# Patient Record
Sex: Female | Born: 1965 | Race: White | Hispanic: No | Smoking: Current every day smoker
Health system: Southern US, Community
[De-identification: ages and names within clinical notes are randomized; demographics above are authoritative.]

## PROBLEM LIST (undated history)

## (undated) ENCOUNTER — Ambulatory Visit (HOSPITAL_COMMUNITY): Admission: EM | Discharge status: Absent without leave | Payer: 59

## (undated) HISTORY — PX: TUBAL LIGATION: SHX77

---

## 1999-03-04 HISTORY — PX: KNEE SURGERY: SHX244

## 2007-05-11 ENCOUNTER — Ambulatory Visit: Payer: Self-pay | Admitting: Nurse Practitioner

## 2007-06-28 ENCOUNTER — Ambulatory Visit: Payer: Self-pay | Admitting: Family Medicine

## 2008-09-26 ENCOUNTER — Ambulatory Visit: Payer: Self-pay | Admitting: Nurse Practitioner

## 2009-03-03 HISTORY — PX: CATARACT EXTRACTION: SUR2

## 2012-08-16 ENCOUNTER — Ambulatory Visit: Payer: Self-pay

## 2012-08-18 ENCOUNTER — Ambulatory Visit: Payer: Self-pay | Admitting: Family Medicine

## 2012-08-19 ENCOUNTER — Ambulatory Visit: Payer: Self-pay

## 2012-08-25 LAB — WOUND CULTURE

## 2017-03-30 ENCOUNTER — Ambulatory Visit (INDEPENDENT_AMBULATORY_CARE_PROVIDER_SITE_OTHER): Payer: Managed Care, Other (non HMO) | Admitting: Internal Medicine

## 2017-03-30 ENCOUNTER — Encounter: Payer: Self-pay | Admitting: Internal Medicine

## 2017-03-30 VITALS — BP 124/84 | HR 84 | Ht 65.0 in | Wt 223.0 lb

## 2017-03-30 DIAGNOSIS — M722 Plantar fascial fibromatosis: Secondary | ICD-10-CM | POA: Diagnosis not present

## 2017-03-30 DIAGNOSIS — L309 Dermatitis, unspecified: Secondary | ICD-10-CM

## 2017-03-30 DIAGNOSIS — Z1231 Encounter for screening mammogram for malignant neoplasm of breast: Secondary | ICD-10-CM | POA: Diagnosis not present

## 2017-03-30 DIAGNOSIS — N951 Menopausal and female climacteric states: Secondary | ICD-10-CM | POA: Insufficient documentation

## 2017-03-30 DIAGNOSIS — F17200 Nicotine dependence, unspecified, uncomplicated: Secondary | ICD-10-CM | POA: Insufficient documentation

## 2017-03-30 DIAGNOSIS — Z6837 Body mass index (BMI) 37.0-37.9, adult: Secondary | ICD-10-CM | POA: Diagnosis not present

## 2017-03-30 DIAGNOSIS — Z1239 Encounter for other screening for malignant neoplasm of breast: Secondary | ICD-10-CM

## 2017-03-30 MED ORDER — TRIAMCINOLONE ACETONIDE 0.1 % EX CREA
1.0000 | TOPICAL_CREAM | Freq: Two times a day (BID) | CUTANEOUS | 0 refills | Status: DC
Start: 2017-03-30 — End: 2017-06-26

## 2017-03-30 MED ORDER — VENLAFAXINE HCL ER 37.5 MG PO CP24: 37.5000 mg | ORAL_CAPSULE | Freq: Every day | ORAL | 5 refills | Status: DC

## 2017-03-30 NOTE — Progress Notes (Signed)
Date:  03/30/2017   Name:  Natalie Osborne   DOB:  10/30/1965   MRN:  [id number]   Chief Complaint: Establish Care (Has not seen doctor in 12 years. ); Foot Pain (Rt foot pain. Does a lot of walking at work. Pain becomes worse with this. ); Rash (Started 3-4 years ago.. Rash shows up on both palms of hands and right foot. ); and Hot Flashes (Constantly hot. Wakes up several times a night to have to stand up and cool down. )  Foot Pain  This is a chronic problem. The current episode started more than 1 year ago. The problem occurs daily. The problem has been unchanged. Associated symptoms include arthralgias and a rash. Pertinent negatives include no abdominal pain, chest pain, chills, coughing, fatigue, fever, headaches, myalgias or neck pain. The symptoms are aggravated by walking. She has tried NSAIDs and ice for the symptoms. The treatment provided mild relief.  Rash  This is a chronic problem. The problem has been waxing and waning since onset. The affected locations include the right foot, left hand and right hand. The rash is characterized by redness, scaling, peeling, itchiness and blistering. Pertinent negatives include no cough, diarrhea, fatigue, fever or shortness of breath.   Hot flashes - no menses in one year.  Severe sweats at night and hot flashes during the day. She tried Animatorstroven with melatonin which helped sleep a bit but none during the day.  HM - has only had one mammogram, no recent Pap.  She has has health screenings with lipids and glucose at work and these have been essentially normal.  Overweight - was able to lose 20 lb on Atkins but stopped the diet and gained it back.  Wondering about other ways to lose weight.   Review of Systems  Constitutional: Negative for chills, fatigue, fever and unexpected weight change.  Eyes: Negative for visual disturbance.  Respiratory: Negative for cough, chest tightness, shortness of breath and wheezing.   Cardiovascular: Positive for  leg swelling. Negative for chest pain and palpitations.  Gastrointestinal: Negative for abdominal pain, constipation and diarrhea.  Genitourinary: Negative for dyspareunia and menstrual problem.  Musculoskeletal: Positive for arthralgias and gait problem. Negative for myalgias, neck pain and neck stiffness.  Skin: Positive for rash.  Neurological: Negative for dizziness and headaches.  Hematological: Negative for adenopathy.  Psychiatric/Behavioral: Positive for sleep disturbance. Negative for decreased concentration and dysphoric mood. The patient is not nervous/anxious.     There are no active problems to display for this patient.   Prior to Admission medications   Medication Sig Start Date End Date Taking? Authorizing Provider  naproxen sodium (ALEVE) 220 MG tablet Take 220 mg by mouth.   Yes [provider]    Allergies  Allergen Reactions  . Penicillins Rash and Other (See Comments)    Reaction as a kid.     Past Surgical History:  Procedure Laterality Date  . CATARACT EXTRACTION  2011   BILATERAL  . KNEE SURGERY  2001   Right Knee  . TUBAL LIGATION      Social History   Tobacco Use  . Smoking status: Current Every Day Smoker    Packs/day: 1.00    Years: 42.00    Pack years: 42.00  . Smokeless tobacco: Never Used  Substance Use Topics  . Alcohol use: Yes    Comment: a drink a day- mixed drink  . Drug use: No     Medication list has been  reviewed and updated.  PHQ 2/9 Scores 03/30/2017  PHQ - 2 Score 0    Physical Exam  Constitutional: She is oriented to person, place, and time. She appears well-developed. No distress.  HENT:  Head: Normocephalic and atraumatic.  Neck: Normal range of motion. Neck supple. No thyromegaly present.  Cardiovascular: Normal rate, regular rhythm and normal heart sounds.  Pulmonary/Chest: Effort normal and breath sounds normal. No respiratory distress. She has no wheezes.  Musculoskeletal:        Feet:  Neurological: She is alert and oriented to person, place, and time.  Skin: Skin is warm and dry. No rash noted.  Eczematous rash hands and right foot  Psychiatric: She has a normal mood and affect. Her behavior is normal. Thought content normal.  Nursing note and vitals reviewed.   BP 124/84   Pulse 84   Ht 5\' 5"  (1.651 m)   Wt 223 lb (101.2 kg)   SpO2 99%   BMI 37.11 kg/m   Assessment and Plan: 1. Eczema of both hands Apply cream at night and cover with cotton gloves and white socks - triamcinolone cream (KENALOG) 0.1 %; Apply 1 application topically 2 (two) times daily.  Dispense: 30 g; Refill: 0  2. Plantar fasciitis of right foot - Ambulatory referral to Podiatry  3. Tobacco dependency Discussed options - pt not ready to quit at this time  4. Menopausal and female climacteric states Not a candidate for HRT due to smoking - venlafaxine XR (EFFEXOR-XR) 37.5 MG 24 hr capsule; Take 1 capsule (37.5 mg total) by mouth daily with breakfast.  Dispense: 30 capsule; Refill: 5  5. Breast cancer screening - MM DIGITAL SCREENING BILATERAL; Future  6. BMI 37.0-37.9, adult She had good success with Atkins - recommend resuming   Meds ordered this encounter  Medications  . triamcinolone cream (KENALOG) 0.1 %    Sig: Apply 1 application topically 2 (two) times daily.    Dispense:  30 g    Refill:  0  . venlafaxine XR (EFFEXOR-XR) 37.5 MG 24 hr capsule    Sig: Take 1 capsule (37.5 mg total) by mouth daily with breakfast.    Dispense:  30 capsule    Refill:  5    Partially dictated using Animal nutritionist. Any errors are unintentional.  Bari Edward, MD [facility]  03/30/2017

## 2017-04-29 ENCOUNTER — Inpatient Hospital Stay: Admission: RE | Admit: 2017-04-29 | Payer: Self-pay | Source: Ambulatory Visit

## 2017-04-30 ENCOUNTER — Ambulatory Visit
Admission: RE | Admit: 2017-04-30 | Discharge: 2017-04-30 | Disposition: A | Discharge status: Home / Self Care | Payer: Managed Care, Other (non HMO) | Source: Ambulatory Visit | Attending: Internal Medicine | Admitting: Internal Medicine

## 2017-04-30 DIAGNOSIS — Z1239 Encounter for other screening for malignant neoplasm of breast: Secondary | ICD-10-CM

## 2017-04-30 DIAGNOSIS — Z1231 Encounter for screening mammogram for malignant neoplasm of breast: Secondary | ICD-10-CM | POA: Diagnosis not present

## 2017-06-26 ENCOUNTER — Encounter: Payer: Self-pay | Admitting: Internal Medicine

## 2017-06-26 ENCOUNTER — Ambulatory Visit (INDEPENDENT_AMBULATORY_CARE_PROVIDER_SITE_OTHER): Payer: Managed Care, Other (non HMO) | Admitting: Internal Medicine

## 2017-06-26 VITALS — BP 123/84 | HR 85 | Resp 16 | Ht 65.0 in | Wt 225.0 lb

## 2017-06-26 DIAGNOSIS — Z0000 Encounter for general adult medical examination without abnormal findings: Secondary | ICD-10-CM

## 2017-06-26 DIAGNOSIS — H6123 Impacted cerumen, bilateral: Secondary | ICD-10-CM | POA: Diagnosis not present

## 2017-06-26 DIAGNOSIS — F17210 Nicotine dependence, cigarettes, uncomplicated: Secondary | ICD-10-CM | POA: Diagnosis not present

## 2017-06-26 DIAGNOSIS — Z6837 Body mass index (BMI) 37.0-37.9, adult: Secondary | ICD-10-CM | POA: Diagnosis not present

## 2017-06-26 DIAGNOSIS — Z1211 Encounter for screening for malignant neoplasm of colon: Secondary | ICD-10-CM

## 2017-06-26 DIAGNOSIS — N951 Menopausal and female climacteric states: Secondary | ICD-10-CM | POA: Diagnosis not present

## 2017-06-26 DIAGNOSIS — F17200 Nicotine dependence, unspecified, uncomplicated: Secondary | ICD-10-CM

## 2017-06-26 DIAGNOSIS — L309 Dermatitis, unspecified: Secondary | ICD-10-CM | POA: Diagnosis not present

## 2017-06-26 LAB — POCT URINALYSIS DIPSTICK
Bilirubin, UA: NEGATIVE
Blood, UA: NEGATIVE
GLUCOSE UA: NEGATIVE
Ketones, UA: NEGATIVE
Nitrite, UA: NEGATIVE
Protein, UA: NEGATIVE
SPEC GRAV UA: 1.015 (ref 1.010–1.025)
Urobilinogen, UA: 0.2 E.U./dL
pH, UA: 6 (ref 5.0–8.0)

## 2017-06-26 MED ORDER — FLUOCINONIDE 0.05 % EX CREA: 1.0000 "application " | TOPICAL_CREAM | Freq: Two times a day (BID) | CUTANEOUS | 0 refills | Status: DC

## 2017-06-26 MED ORDER — ALBUTEROL SULFATE HFA 108 (90 BASE) MCG/ACT IN AERS: 2.0000 | INHALATION_SPRAY | Freq: Four times a day (QID) | RESPIRATORY_TRACT | 0 refills | Status: DC | PRN

## 2017-06-26 NOTE — Progress Notes (Signed)
Date:  06/26/2017   Name:  Natalie Osborne   DOB:  08-01-1965   MRN:  [id number]   Chief Complaint: Annual Exam Natalie Osborne is a 52 y.o. female who presents today for her Complete Annual Exam. She feels well. She reports exercising none. She reports she is sleeping fairly well.  She recently had mammogram - normal.  She was started on Effexor for menopausal sx in January.  She has had a significant decrease in hot flashes.  She did have regular period in January after 14 mo with nothing.  No bleeding since then. She is due for a pap smear and a screening colonoscopy.  Rash  This is a chronic problem. The problem has been waxing and waning since onset. Location: both palms and feet. The rash is characterized by blistering, peeling and redness. She was exposed to nothing. Pertinent negatives include no congestion, cough, diarrhea, fatigue, fever, shortness of breath or vomiting. Past treatments include topical steroids. The treatment provided mild relief.  Shortness of Breath  This is a recurrent problem. The problem occurs intermittently. Associated symptoms include wheezing. Pertinent negatives include no abdominal pain, chest pain, fever, headaches, leg swelling, rash, sputum production or vomiting. The symptoms are aggravated by weather changes and smoke. Risk factors include smoking. She has tried beta agonist inhalers for the symptoms. The treatment provided significant relief.  OVerweight - she has not started back on keto diet; having trouble getting motivated.  She is wondering about phentermine to help jump start her weight loss.   Tobacco - she continues to smoke 1 ppd since age 89.  She stopped once for 4 months while in college.  She tried chantix but had bad nightmares and has not tried it since.  Review of Systems  Constitutional: Negative for chills, fatigue and fever.  HENT: Positive for hearing loss. Negative for congestion, tinnitus, trouble swallowing and voice change.   Eyes:  Negative for visual disturbance.  Respiratory: Positive for wheezing. Negative for cough, sputum production, chest tightness and shortness of breath.   Cardiovascular: Negative for chest pain, palpitations and leg swelling.  Gastrointestinal: Negative for abdominal pain, constipation, diarrhea and vomiting.       Intermittent gerd - relieved by TUMS  Endocrine: Negative for polydipsia and polyuria.  Genitourinary: Negative for dysuria, frequency, genital sores, vaginal bleeding and vaginal discharge.  Musculoskeletal: Positive for arthralgias (foot pain - fasciitis). Negative for gait problem and joint swelling.  Skin: Negative for color change and rash.  Allergic/Immunologic: Negative for environmental allergies.  Neurological: Negative for dizziness, tremors, light-headedness and headaches.  Hematological: Negative for adenopathy. Does not bruise/bleed easily.  Psychiatric/Behavioral: Negative for dysphoric mood and sleep disturbance. The patient is not nervous/anxious.     Patient Active Problem List   Diagnosis Date Noted  . Eczema of both hands 03/30/2017  . Plantar fasciitis of right foot 03/30/2017  . Tobacco dependency 03/30/2017  . Menopausal and female climacteric states 03/30/2017  . BMI 37.0-37.9, adult 03/30/2017    Prior to Admission medications   Medication Sig Start Date End Date Taking? Authorizing Provider  naproxen sodium (ALEVE) 220 MG tablet Take 220 mg by mouth.    [provider]  triamcinolone cream (KENALOG) 0.1 % Apply 1 application topically 2 (two) times daily. 03/30/17   Reubin Milan, MD  venlafaxine XR (EFFEXOR-XR) 37.5 MG 24 hr capsule Take 1 capsule (37.5 mg total) by mouth daily with breakfast. 03/30/17   Reubin Milan, MD  Allergies  Allergen Reactions  . Penicillins Rash and Other (See Comments)    Reaction as a kid.     Past Surgical History:  Procedure Laterality Date  . CATARACT EXTRACTION  2011   BILATERAL  . KNEE  SURGERY  2001   Right Knee  . TUBAL LIGATION      Social History   Tobacco Use  . Smoking status: Current Every Day Smoker    Packs/day: 1.00    Years: 42.00    Pack years: 42.00  . Smokeless tobacco: Never Used  Substance Use Topics  . Alcohol use: Yes    Comment: a drink a day- mixed drink  . Drug use: No     Medication list has been reviewed and updated.  PHQ 2/9 Scores 06/26/2017 03/30/2017  PHQ - 2 Score 0 0    Physical Exam  Constitutional: She is oriented to person, place, and time. She appears well-developed and well-nourished. No distress.  HENT:  Head: Normocephalic and atraumatic.  Nose: Right sinus exhibits no maxillary sinus tenderness. Left sinus exhibits no maxillary sinus tenderness.  Mouth/Throat: Uvula is midline and oropharynx is clear and moist.  Impacted cerumen bilaterally   Eyes: Conjunctivae and EOM are normal. Right eye exhibits no discharge. Left eye exhibits no discharge. No scleral icterus.  Neck: Normal range of motion. Carotid bruit is not present. No erythema present. No thyromegaly present.  Cardiovascular: Normal rate, regular rhythm, normal heart sounds and normal pulses.  Pulmonary/Chest: Effort normal. No respiratory distress. She has no wheezes. Right breast exhibits no mass, no nipple discharge, no skin change and no tenderness. Left breast exhibits no mass, no nipple discharge, no skin change and no tenderness.  Abdominal: Soft. Bowel sounds are normal. There is no hepatosplenomegaly. There is no tenderness. There is no CVA tenderness.  Genitourinary: Vagina normal and uterus normal. There is no tenderness, lesion or injury on the right labia. There is no tenderness, lesion or injury on the left labia. Cervix exhibits no motion tenderness, no discharge and no friability. Right adnexum displays no mass, no tenderness and no fullness. Left adnexum displays no mass, no tenderness and no fullness.  Musculoskeletal: Normal range of motion.    Lymphadenopathy:    She has no cervical adenopathy.    She has no axillary adenopathy.  Neurological: She is alert and oriented to person, place, and time. She has normal reflexes. No cranial nerve deficit or sensory deficit.  Skin: Skin is warm, dry and intact. No rash noted.  Psychiatric: She has a normal mood and affect. Her speech is normal and behavior is normal. Thought content normal.  Nursing note and vitals reviewed.   BP 123/84   Pulse 85   Resp 16   Ht 5\' 5"  (1.651 m)   Wt 225 lb (102.1 kg)   SpO2 98%   BMI 37.44 kg/m   Assessment and Plan: 1. Annual physical exam Encourage regular exercise Monitor BP and return if persistently elevated - CBC with Differential/Platelet - Comprehensive metabolic panel - Lipid panel - Pap IG and HPV (high risk) DNA detection - POCT urinalysis dipstick  2. Colon cancer screening - Ambulatory referral to Gastroenterology  3. Menopausal and female climacteric states Continue Effexor  4. Tobacco dependency Not interested in quitting at this time - Nurse to provide smoking / tobacco cessation education - albuterol (PROAIR HFA) 108 (90 Base) MCG/ACT inhaler; Inhale 2 puffs into the lungs every 6 (six) hours as needed for wheezing or shortness of  breath.  Dispense: 18 g; Refill: 0  5. BMI 37.0-37.9, adult Recommend resuming Keto diet; would prefer not to prescribe phentermine at this time - Hemoglobin A1c - TSH  6. Eczema of both hands Increase steroid strength and wear cotton gloves at night - fluocinonide cream (LIDEX) 0.05 %; Apply 1 application topically 2 (two) times daily.  Dispense: 30 g; Refill: 0  7. Bilateral impacted cerumen - Ambulatory referral to ENT   Meds ordered this encounter  Medications  . fluocinonide cream (LIDEX) 0.05 %    Sig: Apply 1 application topically 2 (two) times daily.    Dispense:  30 g    Refill:  0  . albuterol (PROAIR HFA) 108 (90 Base) MCG/ACT inhaler    Sig: Inhale 2 puffs into the  lungs every 6 (six) hours as needed for wheezing or shortness of breath.    Dispense:  18 g    Refill:  0    Partially dictated using Animal nutritionistDragon software. Any errors are unintentional.  Bari EdwardLaura Berglund, MD [facility]  06/26/2017

## 2017-06-26 NOTE — Patient Instructions (Addendum)
Resume diet with Ketogenic program.  Add in more regular exericise.  Health Maintenance for Postmenopausal Women Menopause is a normal process in which your reproductive ability comes to an end. This process happens gradually over a span of months to years, usually between the ages of 37 and 37. Menopause is complete when you have missed 12 consecutive menstrual periods. It is important to talk with your health care provider about some of the most common conditions that affect postmenopausal women, such as heart disease, cancer, and bone loss (osteoporosis). Adopting a healthy lifestyle and getting preventive care can help to promote your health and wellness. Those actions can also lower your chances of developing some of these common conditions. What should I know about menopause? During menopause, you may experience a number of symptoms, such as:  Moderate-to-severe hot flashes.  Night sweats.  Decrease in sex drive.  Mood swings.  Headaches.  Tiredness.  Irritability.  Memory problems.  Insomnia.  Choosing to treat or not to treat menopausal changes is an individual decision that you make with your health care provider. What should I know about hormone replacement therapy and supplements? Hormone therapy products are effective for treating symptoms that are associated with menopause, such as hot flashes and night sweats. Hormone replacement carries certain risks, especially as you become older. If you are thinking about using estrogen or estrogen with progestin treatments, discuss the benefits and risks with your health care provider. What should I know about heart disease and stroke? Heart disease, heart attack, and stroke become more likely as you age. This may be due, in part, to the hormonal changes that your body experiences during menopause. These can affect how your body processes dietary fats, triglycerides, and cholesterol. Heart attack and stroke are both medical  emergencies. There are many things that you can do to help prevent heart disease and stroke:  Have your blood pressure checked at least every 1-2 years. High blood pressure causes heart disease and increases the risk of stroke.  If you are 1-73 years old, ask your health care provider if you should take aspirin to prevent a heart attack or a stroke.  Do not use any tobacco products, including cigarettes, chewing tobacco, or electronic cigarettes. If you need help quitting, ask your health care provider.  It is important to eat a healthy diet and maintain a healthy weight. ? Be sure to include plenty of vegetables, fruits, low-fat dairy products, and lean protein. ? Avoid eating foods that are high in solid fats, added sugars, or salt (sodium).  Get regular exercise. This is one of the most important things that you can do for your health. ? Try to exercise for at least 150 minutes each week. The type of exercise that you do should increase your heart rate and make you sweat. This is known as moderate-intensity exercise. ? Try to do strengthening exercises at least twice each week. Do these in addition to the moderate-intensity exercise.  Know your numbers.Ask your health care provider to check your cholesterol and your blood glucose. Continue to have your blood tested as directed by your health care provider.  What should I know about cancer screening? There are several types of cancer. Take the following steps to reduce your risk and to catch any cancer development as early as possible. Breast Cancer  Practice breast self-awareness. ? This means understanding how your breasts normally appear and feel. ? It also means doing regular breast self-exams. Let your health care provider know  about any changes, no matter how small.  If you are 16 or older, have a clinician do a breast exam (clinical breast exam or CBE) every year. Depending on your age, family history, and medical history, it  may be recommended that you also have a yearly breast X-ray (mammogram).  If you have a family history of breast cancer, talk with your health care provider about genetic screening.  If you are at high risk for breast cancer, talk with your health care provider about having an MRI and a mammogram every year.  Breast cancer (BRCA) gene test is recommended for women who have family members with BRCA-related cancers. Results of the assessment will determine the need for genetic counseling and BRCA1 and for BRCA2 testing. BRCA-related cancers include these types: ? Breast. This occurs in males or females. ? Ovarian. ? Tubal. This may also be called fallopian tube cancer. ? Cancer of the abdominal or pelvic lining (peritoneal cancer). ? Prostate. ? Pancreatic.  Cervical, Uterine, and Ovarian Cancer Your health care provider may recommend that you be screened regularly for cancer of the pelvic organs. These include your ovaries, uterus, and vagina. This screening involves a pelvic exam, which includes checking for microscopic changes to the surface of your cervix (Pap test).  For women ages 21-65, health care providers may recommend a pelvic exam and a Pap test every three years. For women ages 55-65, they may recommend the Pap test and pelvic exam, combined with testing for human papilloma virus (HPV), every five years. Some types of HPV increase your risk of cervical cancer. Testing for HPV may also be done on women of any age who have unclear Pap test results.  Other health care providers may not recommend any screening for nonpregnant women who are considered low risk for pelvic cancer and have no symptoms. Ask your health care provider if a screening pelvic exam is right for you.  If you have had past treatment for cervical cancer or a condition that could lead to cancer, you need Pap tests and screening for cancer for at least 20 years after your treatment. If Pap tests have been discontinued  for you, your risk factors (such as having a new sexual partner) need to be reassessed to determine if you should start having screenings again. Some women have medical problems that increase the chance of getting cervical cancer. In these cases, your health care provider may recommend that you have screening and Pap tests more often.  If you have a family history of uterine cancer or ovarian cancer, talk with your health care provider about genetic screening.  If you have vaginal bleeding after reaching menopause, tell your health care provider.  There are currently no reliable tests available to screen for ovarian cancer.  Lung Cancer Lung cancer screening is recommended for adults 41-59 years old who are at high risk for lung cancer because of a history of smoking. A yearly low-dose CT scan of the lungs is recommended if you:  Currently smoke.  Have a history of at least 30 pack-years of smoking and you currently smoke or have quit within the past 15 years. A pack-year is smoking an average of one pack of cigarettes per day for one year.  Yearly screening should:  Continue until it has been 15 years since you quit.  Stop if you develop a health problem that would prevent you from having lung cancer treatment.  Colorectal Cancer  This type of cancer can be detected and  can often be prevented.  Routine colorectal cancer screening usually begins at age 11 and continues through age 26.  If you have risk factors for colon cancer, your health care provider may recommend that you be screened at an earlier age.  If you have a family history of colorectal cancer, talk with your health care provider about genetic screening.  Your health care provider may also recommend using home test kits to check for hidden blood in your stool.  A small camera at the end of a tube can be used to examine your colon directly (sigmoidoscopy or colonoscopy). This is done to check for the earliest forms of  colorectal cancer.  Direct examination of the colon should be repeated every 5-10 years until age 64. However, if early forms of precancerous polyps or small growths are found or if you have a family history or genetic risk for colorectal cancer, you may need to be screened more often.  Skin Cancer  Check your skin from head to toe regularly.  Monitor any moles. Be sure to tell your health care provider: ? About any new moles or changes in moles, especially if there is a change in a mole's shape or color. ? If you have a mole that is larger than the size of a pencil eraser.  If any of your family members has a history of skin cancer, especially at a young age, talk with your health care provider about genetic screening.  Always use sunscreen. Apply sunscreen liberally and repeatedly throughout the day.  Whenever you are outside, protect yourself by wearing long sleeves, pants, a wide-brimmed hat, and sunglasses.  What should I know about osteoporosis? Osteoporosis is a condition in which bone destruction happens more quickly than new bone creation. After menopause, you may be at an increased risk for osteoporosis. To help prevent osteoporosis or the bone fractures that can happen because of osteoporosis, the following is recommended:  If you are 49-65 years old, get at least 1,000 mg of calcium and at least 600 mg of vitamin D per day.  If you are older than age 60 but younger than age 19, get at least 1,200 mg of calcium and at least 600 mg of vitamin D per day.  If you are older than age 68, get at least 1,200 mg of calcium and at least 800 mg of vitamin D per day.  Smoking and excessive alcohol intake increase the risk of osteoporosis. Eat foods that are rich in calcium and vitamin D, and do weight-bearing exercises several times each week as directed by your health care provider. What should I know about how menopause affects my mental health? Depression may occur at any age, but it  is more common as you become older. Common symptoms of depression include:  Low or sad mood.  Changes in sleep patterns.  Changes in appetite or eating patterns.  Feeling an overall lack of motivation or enjoyment of activities that you previously enjoyed.  Frequent crying spells.  Talk with your health care provider if you think that you are experiencing depression. What should I know about immunizations? It is important that you get and maintain your immunizations. These include:  Tetanus, diphtheria, and pertussis (Tdap) booster vaccine.  Influenza every year before the flu season begins.  Pneumonia vaccine.  Shingles vaccine.  Your health care provider may also recommend other immunizations. This information is not intended to replace advice given to you by your health care provider. Make sure you discuss  any questions you have with your health care provider. Document Released: 04/11/2005 Document Revised: 09/07/2015 Document Reviewed: 11/21/2014 Elsevier Interactive Patient Education  2018 [employer].

## 2017-06-27 LAB — TSH: TSH: 1.47 u[IU]/mL (ref 0.450–4.500)

## 2017-06-27 LAB — CBC WITH DIFFERENTIAL/PLATELET
BASOS: 0 %
Basophils Absolute: 0 10*3/uL (ref 0.0–0.2)
EOS (ABSOLUTE): 0.2 10*3/uL (ref 0.0–0.4)
EOS: 3 %
HEMATOCRIT: 42.1 % (ref 34.0–46.6)
HEMOGLOBIN: 14.5 g/dL (ref 11.1–15.9)
IMMATURE GRANS (ABS): 0 10*3/uL (ref 0.0–0.1)
Immature Granulocytes: 0 %
LYMPHS: 25 %
Lymphocytes Absolute: 1.8 10*3/uL (ref 0.7–3.1)
MCH: 31.7 pg (ref 26.6–33.0)
MCHC: 34.4 g/dL (ref 31.5–35.7)
MCV: 92 fL (ref 79–97)
Monocytes Absolute: 0.5 10*3/uL (ref 0.1–0.9)
Monocytes: 7 %
NEUTROS ABS: 4.7 10*3/uL (ref 1.4–7.0)
Neutrophils: 65 %
PLATELETS: 268 10*3/uL (ref 150–379)
RBC: 4.57 x10E6/uL (ref 3.77–5.28)
RDW: 13.8 % (ref 12.3–15.4)
WBC: 7.2 10*3/uL (ref 3.4–10.8)

## 2017-06-27 LAB — COMPREHENSIVE METABOLIC PANEL
A/G RATIO: 1.6 (ref 1.2–2.2)
ALBUMIN: 4.4 g/dL (ref 3.5–5.5)
ALT: 26 IU/L (ref 0–32)
AST: 19 IU/L (ref 0–40)
Alkaline Phosphatase: 88 IU/L (ref 39–117)
BUN / CREAT RATIO: 15 (ref 9–23)
BUN: 11 mg/dL (ref 6–24)
Bilirubin Total: 0.5 mg/dL (ref 0.0–1.2)
CALCIUM: 9.3 mg/dL (ref 8.7–10.2)
CO2: 23 mmol/L (ref 20–29)
Chloride: 103 mmol/L (ref 96–106)
Creatinine, Ser: 0.73 mg/dL (ref 0.57–1.00)
GFR, EST AFRICAN AMERICAN: 110 mL/min/{1.73_m2} (ref >59)
GFR, EST NON AFRICAN AMERICAN: 95 mL/min/{1.73_m2} (ref >59)
GLOBULIN, TOTAL: 2.7 g/dL (ref 1.5–4.5)
Glucose: 79 mg/dL (ref 65–99)
Potassium: 4.4 mmol/L (ref 3.5–5.2)
SODIUM: 141 mmol/L (ref 134–144)
TOTAL PROTEIN: 7.1 g/dL (ref 6.0–8.5)

## 2017-06-27 LAB — LIPID PANEL
CHOL/HDL RATIO: 3.2 ratio (ref 0.0–4.4)
Cholesterol, Total: 242 mg/dL — ABNORMAL HIGH (ref 100–199)
HDL: 76 mg/dL (ref >39)
LDL CALC: 141 mg/dL — AB (ref 0–99)
Triglycerides: 123 mg/dL (ref 0–149)
VLDL CHOLESTEROL CAL: 25 mg/dL (ref 5–40)

## 2017-06-27 LAB — HEMOGLOBIN A1C
Est. average glucose Bld gHb Est-mCnc: 120 mg/dL
Hgb A1c MFr Bld: 5.8 % — ABNORMAL HIGH (ref 4.8–5.6)

## 2017-07-01 LAB — PAP IG AND HPV HIGH-RISK
HPV, high-risk: NEGATIVE
PAP SMEAR COMMENT: 0

## 2017-07-23 ENCOUNTER — Encounter: Payer: Self-pay | Admitting: *Deleted

## 2018-04-22 ENCOUNTER — Encounter: Payer: Self-pay | Admitting: Internal Medicine

## 2018-04-22 ENCOUNTER — Ambulatory Visit (INDEPENDENT_AMBULATORY_CARE_PROVIDER_SITE_OTHER): Payer: Managed Care, Other (non HMO) | Admitting: Internal Medicine

## 2018-04-22 ENCOUNTER — Other Ambulatory Visit: Payer: Self-pay

## 2018-04-22 VITALS — BP 128/78 | HR 102 | Temp 98.6°F | Ht 65.0 in | Wt 224.0 lb

## 2018-04-22 DIAGNOSIS — J111 Influenza due to unidentified influenza virus with other respiratory manifestations: Secondary | ICD-10-CM

## 2018-04-22 DIAGNOSIS — N951 Menopausal and female climacteric states: Secondary | ICD-10-CM | POA: Diagnosis not present

## 2018-04-22 DIAGNOSIS — F17200 Nicotine dependence, unspecified, uncomplicated: Secondary | ICD-10-CM

## 2018-04-22 MED ORDER — OSELTAMIVIR PHOSPHATE 75 MG PO CAPS: 75.0000 mg | ORAL_CAPSULE | Freq: Two times a day (BID) | ORAL | 0 refills | Status: AC

## 2018-04-22 MED ORDER — ALBUTEROL SULFATE 108 (90 BASE) MCG/ACT IN AEPB: 2.0000 | INHALATION_SPRAY | Freq: Four times a day (QID) | RESPIRATORY_TRACT | 5 refills | Status: DC | PRN

## 2018-04-22 MED ORDER — VENLAFAXINE HCL ER 37.5 MG PO CP24: 37.5000 mg | ORAL_CAPSULE | Freq: Every day | ORAL | 5 refills | Status: DC

## 2018-04-22 MED ORDER — GUAIFENESIN-CODEINE 100-10 MG/5ML PO SYRP: 5.0000 mL | ORAL_SOLUTION | Freq: Three times a day (TID) | ORAL | 0 refills | Status: DC | PRN

## 2018-04-22 NOTE — Progress Notes (Signed)
Date:  04/22/2018   Name:  Natalie Osborne   DOB:  November 03, 1965   MRN:  [id number]   Chief Complaint: Cough (Cough, runny nose, body aches, and fever. Started Sunday bad Tuesday. )  Influenza  This is a new problem. Episode onset: severe sx onset 2 days ago. The problem occurs constantly. The problem has been unchanged. Associated symptoms include chills, congestion, coughing, a fever, headaches and myalgias. Pertinent negatives include no nausea, sore throat or vomiting. She has tried acetaminophen for the symptoms. The treatment provided mild relief.   Vasomotor sx - she took effexor last year for 2 months with good effect but then stopped it because she was feeling so well.  Now her sx are starting back up and her husband is constantly cold in the house.  She would like a refill.  Review of Systems  Constitutional: Positive for chills and fever.  HENT: Positive for congestion. Negative for sore throat.   Respiratory: Positive for cough.   Gastrointestinal: Negative for nausea and vomiting.  Endocrine: Positive for heat intolerance.  Musculoskeletal: Positive for myalgias.  Neurological: Positive for headaches.    Patient Active Problem List   Diagnosis Date Noted  . Eczema of both hands 03/30/2017  . Plantar fasciitis of right foot 03/30/2017  . Tobacco dependency 03/30/2017  . Menopausal and female climacteric states 03/30/2017  . BMI 37.0-37.9, adult 03/30/2017    Allergies  Allergen Reactions  . Penicillins Rash and Other (See Comments)    Reaction as a kid.     Past Surgical History:  Procedure Laterality Date  . CATARACT EXTRACTION  2011   BILATERAL  . KNEE SURGERY  2001   Right Knee  . TUBAL LIGATION      Social History   Tobacco Use  . Smoking status: Current Every Day Smoker    Packs/day: 1.00    Years: 42.00    Pack years: 42.00  . Smokeless tobacco: Never Used  Substance Use Topics  . Alcohol use: Yes    Comment: a drink a day- mixed drink  . Drug  use: No     Medication list has been reviewed and updated.  No outpatient medications have been marked as taking for the 04/22/18 encounter (Office Visit) with Reubin Milan, MD.    [facility] 2/9 Scores 04/22/2018 06/26/2017 03/30/2017  PHQ - 2 Score 0 0 0    Physical Exam Vitals signs and nursing note reviewed.  Constitutional:      General: She is not in acute distress.    Appearance: She is well-developed. She is ill-appearing.  HENT:     Head: Normocephalic and atraumatic.     Right Ear: Tympanic membrane and ear canal normal.     Left Ear: Tympanic membrane and ear canal normal.     Nose: No congestion or rhinorrhea.     Mouth/Throat:     Mouth: Mucous membranes are moist.     Pharynx: No oropharyngeal exudate or posterior oropharyngeal erythema.  Eyes:     Pupils: Pupils are equal, round, and reactive to light.  Neck:     Musculoskeletal: Normal range of motion.  Cardiovascular:     Rate and Rhythm: Normal rate and regular rhythm.     Pulses: Normal pulses.  Pulmonary:     Effort: Pulmonary effort is normal. No respiratory distress.     Breath sounds: No wheezing or rhonchi.  Musculoskeletal: Normal range of motion.  Lymphadenopathy:     Cervical: No  cervical adenopathy.  Skin:    General: Skin is warm and dry.     Findings: No rash.  Neurological:     Mental Status: She is alert and oriented to person, place, and time.  Psychiatric:        Behavior: Behavior normal.        Thought Content: Thought content normal.     BP 128/78   Pulse (!) 102   Temp 98.6 F (37 C) (Oral)   Ht 5\' 5"  (1.651 m)   Wt 224 lb (101.6 kg)   SpO2 97%   BMI 37.28 kg/m   Assessment and Plan: 1. Influenza Clinically by history Note to be out of work for several days - oseltamivir (TAMIFLU) 75 MG capsule; Take 1 capsule (75 mg total) by mouth 2 (two) times daily for 5 days.  Dispense: 10 capsule; Refill: 0 - guaiFENesin-codeine (ROBITUSSIN AC) 100-10 MG/5ML syrup; Take 5 mLs by  mouth 3 (three) times daily as needed for cough.  Dispense: 118 mL; Refill: 0  2. Menopausal and female climacteric states Resume low dose SNRI - venlafaxine XR (EFFEXOR-XR) 37.5 MG 24 hr capsule; Take 1 capsule (37.5 mg total) by mouth daily with breakfast.  Dispense: 30 capsule; Refill: 5  3. Tobacco dependency Use as needed  - Albuterol Sulfate (PROAIR RESPICLICK) 108 (90 Base) MCG/ACT AEPB; Inhale 2 puffs into the lungs 4 (four) times daily as needed.  Dispense: 1 each; Refill: 5   Partially dictated using Animal nutritionist. Any errors are unintentional.  Bari Edward, MD [facility]  04/22/2018

## 2018-04-22 NOTE — Patient Instructions (Signed)
Continue fluids, rest, tylenol as needed  Return to work 04/26/2018.

## 2019-10-03 ENCOUNTER — Ambulatory Visit
Admission: EM | Admit: 2019-10-03 | Discharge: 2019-10-03 | Disposition: A | Discharge status: Home / Self Care | Payer: 59

## 2019-10-03 ENCOUNTER — Other Ambulatory Visit: Payer: Self-pay

## 2020-03-22 ENCOUNTER — Other Ambulatory Visit: Payer: Self-pay | Admitting: Internal Medicine

## 2020-04-03 ENCOUNTER — Ambulatory Visit: Payer: 59 | Admitting: Internal Medicine

## 2020-04-03 ENCOUNTER — Other Ambulatory Visit: Payer: Self-pay

## 2020-04-03 ENCOUNTER — Encounter: Payer: Self-pay | Admitting: Internal Medicine

## 2020-04-03 VITALS — BP 138/88 | HR 86 | Ht 65.0 in | Wt 200.6 lb

## 2020-04-03 DIAGNOSIS — L309 Dermatitis, unspecified: Secondary | ICD-10-CM

## 2020-04-03 DIAGNOSIS — M62838 Other muscle spasm: Secondary | ICD-10-CM

## 2020-04-03 DIAGNOSIS — F17200 Nicotine dependence, unspecified, uncomplicated: Secondary | ICD-10-CM

## 2020-04-03 MED ORDER — PROAIR RESPICLICK 108 (90 BASE) MCG/ACT IN AEPB: 2.0000 | INHALATION_SPRAY | Freq: Four times a day (QID) | RESPIRATORY_TRACT | 5 refills | Status: DC | PRN

## 2020-04-03 MED ORDER — CYCLOBENZAPRINE HCL 10 MG PO TABS: 10.0000 mg | ORAL_TABLET | Freq: Three times a day (TID) | ORAL | 1 refills | Status: DC | PRN

## 2020-04-03 NOTE — Progress Notes (Signed)
Date:  04/03/2020   Name:  Natalie Osborne   DOB:  [birth year]   MRN:  [id number]   Chief Complaint: Neck Pain (Persistent pain and tingling in R) neck for 6 weeks. Pain radiates into R) shoulder. Weakness in upper arm. Started out light but now its shooting into neck, into jaw and ear ( at times on and off. )), COPD (Needs refill on albuterol inhaler for SOB at night. PRN. Been out for a while and would like to have PRN.), and Rash (Hands and feet rash. Treated in the past and still an issue. Wants to revisit this issue today. Past treatments did not work.)  Neck Pain  This is a chronic problem. Episode onset: 6 weeks. The problem occurs daily. The problem has been unchanged. The pain is associated with nothing. The pain is present in the right side. The quality of the pain is described as aching and cramping. The pain is moderate. Pertinent negatives include no chest pain, fever or headaches. She has tried NSAIDs and heat for the symptoms. The treatment provided mild relief.  Rash This is a chronic problem. The current episode started more than 1 year ago. The problem has been waxing and waning since onset. The affected locations include the left hand and right hand. The rash is characterized by blistering, burning and itchiness. Associated with: latex gloves. Pertinent negatives include no fatigue, fever or shortness of breath. Past treatments include topical steroids (two different ones with no improvement). The treatment provided no relief.   SOB - smokes daily, has wheezing intermittently, worse at night.  Uses albuterol PRN.  She does not feel that she can quit at this time.  Chantix is now off the market.   Lab Results  Component Value Date   CREATININE 0.73 06/26/2017   BUN 11 06/26/2017   NA 141 06/26/2017   K 4.4 06/26/2017   CL 103 06/26/2017   CO2 23 06/26/2017   Lab Results  Component Value Date   CHOL 242 (H) 06/26/2017   HDL 76 06/26/2017   LDLCALC 141 (H) 06/26/2017   TRIG  123 06/26/2017   CHOLHDL 3.2 06/26/2017   Lab Results  Component Value Date   TSH 1.470 06/26/2017   Lab Results  Component Value Date   HGBA1C 5.8 (H) 06/26/2017   Lab Results  Component Value Date   WBC 7.2 06/26/2017   HGB 14.5 06/26/2017   HCT 42.1 06/26/2017   MCV 92 06/26/2017   PLT 268 06/26/2017   Lab Results  Component Value Date   ALT 26 06/26/2017   AST 19 06/26/2017   ALKPHOS 88 06/26/2017   BILITOT 0.5 06/26/2017     Review of Systems  Constitutional: Negative for chills, fatigue and fever.  Respiratory: Positive for wheezing. Negative for chest tightness and shortness of breath.   Cardiovascular: Negative for chest pain, palpitations and leg swelling.  Musculoskeletal: Positive for myalgias and neck pain. Negative for arthralgias and gait problem.  Skin: Positive for rash.  Neurological: Negative for dizziness and headaches.  Psychiatric/Behavioral: Negative for dysphoric mood. The patient is not nervous/anxious.     Patient Active Problem List   Diagnosis Date Noted  . Eczema of both hands 03/30/2017  . Plantar fasciitis of right foot 03/30/2017  . Tobacco dependency 03/30/2017  . Menopausal and female climacteric states 03/30/2017  . BMI 37.0-37.9, adult 03/30/2017    Allergies  Allergen Reactions  . Penicillins Rash and Other (See Comments)  Reaction as a kid.     Past Surgical History:  Procedure Laterality Date  . CATARACT EXTRACTION  2011   BILATERAL  . KNEE SURGERY  2001   Right Knee  . TUBAL LIGATION      Social History   Tobacco Use  . Smoking status: Current Every Day Smoker    Packs/day: 1.00    Years: 42.00    Pack years: 42.00  . Smokeless tobacco: Never Used  Vaping Use  . Vaping Use: Never used  Substance Use Topics  . Alcohol use: Yes    Comment: a drink a day- mixed drink  . Drug use: No     Medication list has been reviewed and updated.  Current Meds  Medication Sig  . cyclobenzaprine (FLEXERIL) 10 MG  tablet Take 1 tablet (10 mg total) by mouth 3 (three) times daily as needed for muscle spasms.  Marland Kitchen ibuprofen (ADVIL) 600 MG tablet Take 600 mg by mouth every 6 (six) hours as needed.    PHQ 2/9 Scores 04/03/2020 04/22/2018 06/26/2017 03/30/2017  PHQ - 2 Score 2 0 0 0  PHQ- 9 Score 7 - - -    GAD 7 : Generalized Anxiety Score 04/03/2020  Nervous, Anxious, on Edge 0  Control/stop worrying 0  Worry too much - different things 0  Trouble relaxing 0  Restless 0  Easily annoyed or irritable 0  Afraid - awful might happen 0  Total GAD 7 Score 0  Anxiety Difficulty Not difficult at all    BP Readings from Last 3 Encounters:  04/03/20 138/88  04/22/18 128/78  06/26/17 123/84    Physical Exam Vitals and nursing note reviewed.  Constitutional:      General: She is not in acute distress.    Appearance: She is well-developed.  HENT:     Head: Normocephalic and atraumatic.  Cardiovascular:     Rate and Rhythm: Normal rate and regular rhythm.     Pulses: Normal pulses.     Heart sounds: No murmur heard.   Pulmonary:     Effort: Pulmonary effort is normal. No respiratory distress.     Breath sounds: Normal breath sounds and air entry. No wheezing or rhonchi.  Musculoskeletal:     Cervical back: Spasms and tenderness present. Decreased range of motion.     Right lower leg: No edema.     Left lower leg: No edema.  Lymphadenopathy:     Cervical: No cervical adenopathy.  Skin:    General: Skin is warm and dry.     Capillary Refill: Capillary refill takes less than 2 seconds.     Findings: Rash present.     Comments: Peeling blistering fine rash on both palms  Neurological:     General: No focal deficit present.     Mental Status: She is alert and oriented to person, place, and time.     Sensory: Sensation is intact.     Motor: Motor function is intact.     Deep Tendon Reflexes:     Reflex Scores:      Bicep reflexes are 2+ on the right side and 2+ on the left side. Psychiatric:         Mood and Affect: Mood and affect and mood normal.        Behavior: Behavior normal.     Wt Readings from Last 3 Encounters:  04/03/20 200 lb 9.6 oz (91 kg)  04/22/18 224 lb (101.6 kg)  06/26/17 225 lb (102.1  kg)    BP 138/88   Pulse 86   Ht 5\' 5"  (1.651 m)   Wt 200 lb 9.6 oz (91 kg)   SpO2 99%   BMI 33.38 kg/m   Assessment and Plan: 1. Muscle spasm of right shoulder Continue heat, Advil and topical rubs Add flexeril at bedtime; can take tid if not driving - cyclobenzaprine (FLEXERIL) 10 MG tablet; Take 1 tablet (10 mg total) by mouth 3 (three) times daily as needed for muscle spasms.  Dispense: 30 tablet; Refill: 1  2. Tobacco dependency No recent CXR Would be candidate for LDCT screening Not interested in quitting at this time - Albuterol Sulfate (PROAIR RESPICLICK) 108 (90 Base) MCG/ACT AEPB; Inhale 2 puffs into the lungs 4 (four) times daily as needed.  Dispense: 1 each; Refill: 5  3. Eczema of both hands With similar rash now on feet - Ambulatory referral to Dermatology   Partially dictated using Dragon software. Any errors are unintentional.  , MD [facility]  04/03/2020

## 2020-04-05 ENCOUNTER — Encounter: Payer: Self-pay | Admitting: Internal Medicine

## 2020-08-01 ENCOUNTER — Other Ambulatory Visit: Payer: Self-pay

## 2020-08-01 ENCOUNTER — Ambulatory Visit
Admission: RE | Admit: 2020-08-01 | Discharge: 2020-08-01 | Disposition: A | Discharge status: Home / Self Care | Payer: 59 | Source: Ambulatory Visit | Attending: Internal Medicine | Admitting: Internal Medicine

## 2020-08-01 ENCOUNTER — Ambulatory Visit
Admission: RE | Admit: 2020-08-01 | Discharge: 2020-08-01 | Disposition: A | Discharge status: Home / Self Care | Payer: 59 | Attending: Internal Medicine | Admitting: Internal Medicine

## 2020-08-01 ENCOUNTER — Encounter: Payer: Self-pay | Admitting: Internal Medicine

## 2020-08-01 ENCOUNTER — Ambulatory Visit (INDEPENDENT_AMBULATORY_CARE_PROVIDER_SITE_OTHER): Payer: 59 | Admitting: Internal Medicine

## 2020-08-01 VITALS — BP 132/80 | HR 71 | Ht 65.0 in | Wt 208.8 lb

## 2020-08-01 DIAGNOSIS — G8929 Other chronic pain: Secondary | ICD-10-CM

## 2020-08-01 DIAGNOSIS — M25511 Pain in right shoulder: Secondary | ICD-10-CM

## 2020-08-01 DIAGNOSIS — Z1159 Encounter for screening for other viral diseases: Secondary | ICD-10-CM | POA: Diagnosis not present

## 2020-08-01 DIAGNOSIS — Z1231 Encounter for screening mammogram for malignant neoplasm of breast: Secondary | ICD-10-CM

## 2020-08-01 DIAGNOSIS — Z0000 Encounter for general adult medical examination without abnormal findings: Secondary | ICD-10-CM

## 2020-08-01 DIAGNOSIS — Z1211 Encounter for screening for malignant neoplasm of colon: Secondary | ICD-10-CM

## 2020-08-01 DIAGNOSIS — F17200 Nicotine dependence, unspecified, uncomplicated: Secondary | ICD-10-CM

## 2020-08-01 NOTE — Progress Notes (Signed)
Date:  08/01/2020   Name:  Natalie Osborne   DOB:  May 30, 1965   MRN:  [id number]   Chief Complaint: Annual Exam (Breast Exam. No pap until 2024.) and Shoulder Pain (Was having this issue before. It went away and just came back 3 days ago. She did have a fall. Flexeril does not help. )  Natalie Osborne is a 55 y.o. female who presents today for her Complete Annual Exam. She feels well. She reports exercising none. She reports she is sleeping well. Breast complaints none.She still has fatigue from Covid-19 three weeks ago.  All other symptoms have resolved.  Mammogram: 04/2017 MCM DEXA: none Pap smear: 06/2017 neg with cotesting.  Last menses 24 months Colonoscopy: none  Immunization History  Administered Date(s) Administered  . Influenza-Unspecified 11/29/2016, 12/02/2019  . PFIZER(Purple Top)SARS-COV-2 Vaccination 10/07/2019, 10/27/2019, 03/30/2020    Shoulder Pain  The pain is present in the right shoulder. This is a recurrent problem. The quality of the pain is described as burning and aching. The pain is moderate. Associated symptoms include a limited range of motion. Pertinent negatives include no fever, numbness, stiffness or tingling. Treatments tried: muscle relaxants.    Lab Results  Component Value Date   CREATININE 0.73 06/26/2017   BUN 11 06/26/2017   NA 141 06/26/2017   K 4.4 06/26/2017   CL 103 06/26/2017   CO2 23 06/26/2017   Lab Results  Component Value Date   CHOL 242 (H) 06/26/2017   HDL 76 06/26/2017   LDLCALC 141 (H) 06/26/2017   TRIG 123 06/26/2017   CHOLHDL 3.2 06/26/2017   Lab Results  Component Value Date   TSH 1.470 06/26/2017   Lab Results  Component Value Date   HGBA1C 5.8 (H) 06/26/2017   Lab Results  Component Value Date   WBC 7.2 06/26/2017   HGB 14.5 06/26/2017   HCT 42.1 06/26/2017   MCV 92 06/26/2017   PLT 268 06/26/2017   Lab Results  Component Value Date   ALT 26 06/26/2017   AST 19 06/26/2017   ALKPHOS 88 06/26/2017   BILITOT  0.5 06/26/2017     Review of Systems  Constitutional: Negative for chills, fatigue and fever.  HENT: Negative for congestion, hearing loss, tinnitus, trouble swallowing and voice change.   Eyes: Negative for visual disturbance.  Respiratory: Negative for cough, chest tightness, shortness of breath and wheezing.   Cardiovascular: Negative for chest pain, palpitations and leg swelling.  Gastrointestinal: Negative for abdominal pain, constipation, diarrhea and vomiting.  Endocrine: Negative for polydipsia and polyuria.  Genitourinary: Negative for dysuria, frequency, genital sores, vaginal bleeding and vaginal discharge.  Musculoskeletal: Positive for arthralgias and myalgias. Negative for gait problem, joint swelling and stiffness.  Skin: Positive for rash. Negative for color change.  Neurological: Negative for dizziness, tingling, tremors, light-headedness, numbness and headaches.  Hematological: Negative for adenopathy. Does not bruise/bleed easily.  Psychiatric/Behavioral: Negative for dysphoric mood and sleep disturbance. The patient is not nervous/anxious.     Patient Active Problem List   Diagnosis Date Noted  . Eczema of both hands 03/30/2017  . Plantar fasciitis of right foot 03/30/2017  . Tobacco dependency 03/30/2017  . Menopausal and female climacteric states 03/30/2017  . BMI 37.0-37.9, adult 03/30/2017    Allergies  Allergen Reactions  . Penicillins Rash and Other (See Comments)    Reaction as a kid.     Past Surgical History:  Procedure Laterality Date  . CATARACT EXTRACTION  2011  BILATERAL  . KNEE SURGERY  2001   Right Knee  . TUBAL LIGATION      Social History   Tobacco Use  . Smoking status: Current Every Day Smoker    Packs/day: 1.00    Years: 42.00    Pack years: 42.00  . Smokeless tobacco: Never Used  Vaping Use  . Vaping Use: Never used  Substance Use Topics  . Alcohol use: Yes    Comment: a drink a day- mixed drink  . Drug use: No      Medication list has been reviewed and updated.  Current Meds  Medication Sig  . Albuterol Sulfate (PROAIR RESPICLICK) 108 (90 Base) MCG/ACT AEPB Inhale 2 puffs into the lungs 4 (four) times daily as needed.  . cyclobenzaprine (FLEXERIL) 10 MG tablet Take 1 tablet (10 mg total) by mouth 3 (three) times daily as needed for muscle spasms.  Marland Kitchen ibuprofen (ADVIL) 600 MG tablet Take 600 mg by mouth every 6 (six) hours as needed.    PHQ 2/9 Scores 08/01/2020 04/03/2020 04/22/2018 06/26/2017  PHQ - 2 Score 0 2 0 0  PHQ- 9 Score 2 7 - -    GAD 7 : Generalized Anxiety Score 08/01/2020 04/03/2020  Nervous, Anxious, on Edge 0 0  Control/stop worrying 0 0  Worry too much - different things 0 0  Trouble relaxing 0 0  Restless 0 0  Easily annoyed or irritable 0 0  Afraid - awful might happen 0 0  Total GAD 7 Score 0 0  Anxiety Difficulty Not difficult at all Not difficult at all    BP Readings from Last 3 Encounters:  08/01/20 132/80  04/03/20 138/88  04/22/18 128/78    Physical Exam Vitals and nursing note reviewed.  Constitutional:      General: She is not in acute distress.    Appearance: She is well-developed.  HENT:     Head: Normocephalic and atraumatic.     Right Ear: Tympanic membrane and ear canal normal.     Left Ear: Tympanic membrane and ear canal normal.     Nose:     Right Sinus: No maxillary sinus tenderness.     Left Sinus: No maxillary sinus tenderness.  Eyes:     General: No scleral icterus.       Right eye: No discharge.        Left eye: No discharge.     Conjunctiva/sclera: Conjunctivae normal.  Neck:     Thyroid: No thyromegaly.     Vascular: No carotid bruit.  Cardiovascular:     Rate and Rhythm: Normal rate and regular rhythm.     Pulses: Normal pulses.     Heart sounds: Normal heart sounds.  Pulmonary:     Effort: Pulmonary effort is normal. No respiratory distress.     Breath sounds: No wheezing.  Chest:  Breasts:     Right: No mass, nipple  discharge, skin change or tenderness.     Left: No mass, nipple discharge, skin change or tenderness.    Abdominal:     General: Bowel sounds are normal.     Palpations: Abdomen is soft.     Tenderness: There is no abdominal tenderness.  Musculoskeletal:     Right shoulder: Tenderness (muscle spasm upper trapezius and anterior upper deltoid) present. No swelling, deformity or effusion. Normal range of motion.     Left shoulder: Normal.     Cervical back: Normal and normal range of motion. No erythema.  Right lower leg: No edema.     Left lower leg: No edema.  Lymphadenopathy:     Cervical: No cervical adenopathy.  Skin:    General: Skin is warm and dry.     Findings: No rash.  Neurological:     Mental Status: She is alert and oriented to person, place, and time.     Cranial Nerves: No cranial nerve deficit.     Sensory: No sensory deficit.     Deep Tendon Reflexes: Reflexes are normal and symmetric.  Psychiatric:        Attention and Perception: Attention and perception normal.        Mood and Affect: Mood normal.     Wt Readings from Last 3 Encounters:  08/01/20 208 lb 12.8 oz (94.7 kg)  04/03/20 200 lb 9.6 oz (91 kg)  04/22/18 224 lb (101.6 kg)    BP 132/80   Pulse 71   Ht 5\' 5"  (1.651 m)   Wt 208 lb 12.8 oz (94.7 kg)   SpO2 96%   BMI 34.75 kg/m   Assessment and Plan: 1. Annual physical exam Exam is normal except for weight. Encourage regular exercise and appropriate dietary changes. - CBC with Differential/Platelet - Comprehensive metabolic panel - Lipid panel - TSH - VITAMIN D 25 Hydroxy (Vit-D Deficiency, Fractures)  2. Encounter for screening mammogram for breast cancer Schedule mammogram at [facility] - MM 3D SCREEN BREAST BILATERAL; Future  3. Colon cancer screening Referred to [facility] per patient request - Ambulatory referral to Gastroenterology  4. Need for hepatitis C screening test - Hepatitis C antibody  5. Tobacco dependency Pt continues to  smoke with no motivation to quit at this time Will refer for LDCT screening - Ambulatory Referral for Lung Cancer Scre  6. Chronic right shoulder pain Suspect soft tissue inflammation/injury Case discussed with Dr. [facility] - will obtain imaging and refer if patient desired. - DG Shoulder Right; Future   Partially dictated using Dragon software. Any errors are unintentional.  Ashley Royalty, MD [facility]  08/01/2020

## 2020-08-02 ENCOUNTER — Encounter: Payer: Self-pay | Admitting: Internal Medicine

## 2020-08-02 DIAGNOSIS — E782 Mixed hyperlipidemia: Secondary | ICD-10-CM | POA: Insufficient documentation

## 2020-08-02 LAB — COMPREHENSIVE METABOLIC PANEL
ALT: 13 IU/L (ref 0–32)
AST: 16 IU/L (ref 0–40)
Albumin/Globulin Ratio: 1.7 (ref 1.2–2.2)
Albumin: 4.4 g/dL (ref 3.8–4.9)
Alkaline Phosphatase: 85 IU/L (ref 44–121)
BUN/Creatinine Ratio: 15 (ref 9–23)
BUN: 11 mg/dL (ref 6–24)
Bilirubin Total: 0.4 mg/dL (ref 0.0–1.2)
CO2: 23 mmol/L (ref 20–29)
Calcium: 10.1 mg/dL (ref 8.7–10.2)
Chloride: 101 mmol/L (ref 96–106)
Creatinine, Ser: 0.73 mg/dL (ref 0.57–1.00)
Globulin, Total: 2.6 g/dL (ref 1.5–4.5)
Glucose: 88 mg/dL (ref 65–99)
Potassium: 4.9 mmol/L (ref 3.5–5.2)
Sodium: 140 mmol/L (ref 134–144)
Total Protein: 7 g/dL (ref 6.0–8.5)
eGFR: 97 mL/min/{1.73_m2} (ref >59)

## 2020-08-02 LAB — TSH: TSH: 1.49 u[IU]/mL (ref 0.450–4.500)

## 2020-08-02 LAB — CBC WITH DIFFERENTIAL/PLATELET
Basophils Absolute: 0.1 10*3/uL (ref 0.0–0.2)
Basos: 1 %
EOS (ABSOLUTE): 0.2 10*3/uL (ref 0.0–0.4)
Eos: 3 %
Hematocrit: 42.4 % (ref 34.0–46.6)
Hemoglobin: 14.2 g/dL (ref 11.1–15.9)
Immature Grans (Abs): 0 10*3/uL (ref 0.0–0.1)
Immature Granulocytes: 0 %
Lymphocytes Absolute: 1.8 10*3/uL (ref 0.7–3.1)
Lymphs: 30 %
MCH: 30.7 pg (ref 26.6–33.0)
MCHC: 33.5 g/dL (ref 31.5–35.7)
MCV: 92 fL (ref 79–97)
Monocytes Absolute: 0.5 10*3/uL (ref 0.1–0.9)
Monocytes: 9 %
Neutrophils Absolute: 3.4 10*3/uL (ref 1.4–7.0)
Neutrophils: 57 %
Platelets: 231 10*3/uL (ref 150–450)
RBC: 4.63 x10E6/uL (ref 3.77–5.28)
RDW: 12.8 % (ref 11.7–15.4)
WBC: 5.9 10*3/uL (ref 3.4–10.8)

## 2020-08-02 LAB — LIPID PANEL
Chol/HDL Ratio: 3.2 ratio (ref 0.0–4.4)
Cholesterol, Total: 247 mg/dL — ABNORMAL HIGH (ref 100–199)
HDL: 77 mg/dL (ref >39)
LDL Chol Calc (NIH): 149 mg/dL — ABNORMAL HIGH (ref 0–99)
Triglycerides: 123 mg/dL (ref 0–149)
VLDL Cholesterol Cal: 21 mg/dL (ref 5–40)

## 2020-08-02 LAB — HEPATITIS C ANTIBODY: Hep C Virus Ab: <0.1 s/co ratio (ref 0.0–0.9)

## 2020-08-02 LAB — VITAMIN D 25 HYDROXY (VIT D DEFICIENCY, FRACTURES): Vit D, 25-Hydroxy: 21.5 ng/mL — ABNORMAL LOW (ref 30.0–100.0)

## 2020-08-02 NOTE — Progress Notes (Signed)
Called pt left VM to call back.  [facility] nurse may give results to patient if they return call to the clinic. CRM has also been created for this message for call center purposes  KP

## 2020-08-25 ENCOUNTER — Encounter: Payer: Self-pay | Admitting: Internal Medicine

## 2020-08-28 ENCOUNTER — Telehealth: Payer: Self-pay | Admitting: *Deleted

## 2020-08-28 NOTE — Telephone Encounter (Signed)
Received referral for low dose lung cancer screening CT scan. Message left at phone number listed in EMR for patient to call me back to facilitate scheduling scan.  

## 2020-09-10 ENCOUNTER — Encounter: Payer: Self-pay | Admitting: Family Medicine

## 2020-09-10 ENCOUNTER — Other Ambulatory Visit: Payer: Self-pay

## 2020-09-10 ENCOUNTER — Ambulatory Visit: Payer: 59 | Admitting: Family Medicine

## 2020-09-10 ENCOUNTER — Ambulatory Visit
Admission: RE | Admit: 2020-09-10 | Discharge: 2020-09-10 | Disposition: A | Discharge status: Home / Self Care | Payer: 59 | Attending: Family Medicine | Admitting: Family Medicine

## 2020-09-10 ENCOUNTER — Ambulatory Visit
Admission: RE | Admit: 2020-09-10 | Discharge: 2020-09-10 | Disposition: A | Discharge status: Home / Self Care | Payer: 59 | Source: Ambulatory Visit | Attending: Family Medicine | Admitting: Family Medicine

## 2020-09-10 VITALS — BP 158/92 | HR 85 | Temp 98.1°F | Ht 65.0 in | Wt 210.0 lb

## 2020-09-10 DIAGNOSIS — M542 Cervicalgia: Secondary | ICD-10-CM

## 2020-09-10 MED ORDER — CYCLOBENZAPRINE HCL 10 MG PO TABS: 10.0000 mg | ORAL_TABLET | Freq: Three times a day (TID) | ORAL | 0 refills | Status: DC | PRN

## 2020-09-10 MED ORDER — DICLOFENAC SODIUM 50 MG PO TBEC: 50.0000 mg | DELAYED_RELEASE_TABLET | Freq: Two times a day (BID) | ORAL | 0 refills | Status: DC

## 2020-09-10 NOTE — Progress Notes (Signed)
Primary Care / Sports Medicine Office Visit  Patient Information:  Patient ID: Natalie Osborne, female DOB: 02/28/1966 Age: 55 y.o. MRN: [id number]   Natalie Osborne is a pleasant 55 y.o. female presenting with the following:  Chief Complaint  Patient presents with   New Patient (Initial Visit)   Shoulder Pain    Right; started 03/2020; patient thought she slept wrong, no known injury, but did have a fall about a month ago and landed on both palms and this helped patient gain back range of motion; tingling, aching, radiates down to elbow; taking ibuprofen 600 mg three times daily with no relief, was also taking cyclobenzaprine daily for 2 months with no relief; heat, ice, stretching also not effective for relief; right-handedness; 4/10 pain   Neck Pain    Right-sided; started 03/2020 along with shoulder pain, started mid-neck as a muscle spasm "like a Charley-horse" and now is a dull, constant, ache; worse at night; 4/10 pain    Review of Systems pertinent details above   Patient Active Problem List   Diagnosis Date Noted   Cervicalgia 09/10/2020   Mixed hyperlipidemia 08/02/2020   Eczema of both hands 03/30/2017   Plantar fasciitis of right foot 03/30/2017   Tobacco dependency 03/30/2017   Menopausal and female climacteric states 03/30/2017   BMI 37.0-37.9, adult 03/30/2017   History reviewed. No pertinent past medical history. Outpatient Encounter Medications as of 09/10/2020  Medication Sig   cyclobenzaprine (FLEXERIL) 10 MG tablet Take 1 tablet (10 mg total) by mouth 3 (three) times daily as needed for muscle spasms.   diclofenac (VOLTAREN) 50 MG EC tablet Take 1 tablet (50 mg total) by mouth 2 (two) times daily.   ibuprofen (ADVIL) 600 MG tablet Take 600 mg by mouth every 6 (six) hours as needed.   Albuterol Sulfate (PROAIR RESPICLICK) 108 (90 Base) MCG/ACT AEPB Inhale 2 puffs into the lungs 4 (four) times daily as needed.   [DISCONTINUED] cyclobenzaprine (FLEXERIL) 10 MG tablet  Take 1 tablet (10 mg total) by mouth 3 (three) times daily as needed for muscle spasms.   No facility-administered encounter medications on file as of 09/10/2020.   Past Surgical History:  Procedure Laterality Date   CATARACT EXTRACTION  2011   BILATERAL   KNEE SURGERY  2001   Right Knee   TUBAL LIGATION      Vitals:   09/10/20 0936  BP: (!) 158/92  Pulse: 85  Temp: 98.1 F (36.7 C)  SpO2: 98%   Vitals:   09/10/20 0936  Weight: 210 lb (95.3 kg)  Height: 5\' 5"  (1.651 m)   Body mass index is 34.95 kg/m.  No results found.   Independent interpretation of notes and tests performed by another provider:   Independent interpretation of right shoulder x-ray dated 08/01/2020 reveals maintained glenohumeral joint space and alignment, subtle cortical roughening on axillary view at the humeral head possibly consistent with insertional rotator cuff enthesophyte, no acute osseous process identified  Procedures performed:   None  Pertinent History, Exam, Impression, and Recommendations:   Cervicalgia Patient with symptoms ongoing for roughly 6 months, atraumatic in onset, noted at the posterior lateral right neck/shoulder.  At onset she did have local paresthesias which resolved with cyclobenzaprine, since that time she has noted worsening symptoms towards the evening, early a.m., with prolonged standing and upper body activity.  He has noted subjective weakness in the right upper extremity and limited range of motion in the neck.  She does  give history of on and off transient neck stiffness and occasional paresthesias to her fourth and fifth digits on the right.  She is right-hand dominant.  Physical exam today reveals significant limited range of motion during right torsion, right lateral deviation, and extension.  Positive tenderness along the levator scapula and upper trapezius on the right.  Spurling is equivocal on the right, negative on the left, upper extremity strength is 5/5  bilaterally, shoulder exam reveals mild discomfort with full strength during external and internal testing, equivocal impingement, sensation otherwise intact and symmetric in the upper extremities.  Overall clinical course and findings today are most consistent with cervicalgia with concern for underlying spondylosis, we will further evaluate this with dedicated x-rays.  She does have mild secondary/compensatory findings at her right shoulder and significant paraspinal spasm.  I have advised scheduled diclofenac, nightly cyclobenzaprine with additional doses being as needed, and close follow-up in 2 weeks.  Pending x-rays and response to medications, we can further escalate therapy to oral prednisone, local trigger point injections.  Once appropriate, she would benefit from formal or home-based rehab.  Patient is understanding and amenable to this plan moving forward.    Orders & Medications Meds ordered this encounter  Medications   diclofenac (VOLTAREN) 50 MG EC tablet    Sig: Take 1 tablet (50 mg total) by mouth 2 (two) times daily.    Dispense:  30 tablet    Refill:  0   cyclobenzaprine (FLEXERIL) 10 MG tablet    Sig: Take 1 tablet (10 mg total) by mouth 3 (three) times daily as needed for muscle spasms.    Dispense:  30 tablet    Refill:  0    Orders Placed This Encounter  Procedures   DG Cervical Spine Complete      Return in about 2 weeks (around 09/24/2020) for follow-up neck, 4:20 / latest slot OK.     Jerrol Banana, MD   Primary Care Sports Medicine [facility]

## 2020-09-10 NOTE — Patient Instructions (Signed)
-   Obtain x-rays with order in system - Start diclofenac, dose every a.m. and every p.m. scheduled with food (no other NSAIDs while on this medication, Tylenol/acetaminophen okay) - Dose nightly cyclobenzaprine, additional doses to be taken every 8 hours on an as-needed basis (side effect is drowsiness, no driving/machinery x8 hours after dosing) - Maintain gentle neck motion throughout the day using symptoms as a guide, take rest/modify activity when symptomatic - Can utilize moist heat for muscle tightness pain - Return for follow-up in 2 weeks, contact for any questions between now and then

## 2020-09-10 NOTE — Assessment & Plan Note (Signed)
Patient with symptoms ongoing for roughly 6 months, atraumatic in onset, noted at the posterior lateral right neck/shoulder.  At onset she did have local paresthesias which resolved with cyclobenzaprine, since that time she has noted worsening symptoms towards the evening, early a.m., with prolonged standing and upper body activity.  He has noted subjective weakness in the right upper extremity and limited range of motion in the neck.  She does give history of on and off transient neck stiffness and occasional paresthesias to her fourth and fifth digits on the right.  She is right-hand dominant.  Physical exam today reveals significant limited range of motion during right torsion, right lateral deviation, and extension.  Positive tenderness along the levator scapula and upper trapezius on the right.  Spurling is equivocal on the right, negative on the left, upper extremity strength is 5/5 bilaterally, shoulder exam reveals mild discomfort with full strength during external and internal testing, equivocal impingement, sensation otherwise intact and symmetric in the upper extremities.  Overall clinical course and findings today are most consistent with cervicalgia with concern for underlying spondylosis, we will further evaluate this with dedicated x-rays.  She does have mild secondary/compensatory findings at her right shoulder and significant paraspinal spasm.  I have advised scheduled diclofenac, nightly cyclobenzaprine with additional doses being as needed, and close follow-up in 2 weeks.  Pending x-rays and response to medications, we can further escalate therapy to oral prednisone, local trigger point injections.  Once appropriate, she would benefit from formal or home-based rehab.  Patient is understanding and amenable to this plan moving forward.

## 2020-09-18 NOTE — Progress Notes (Signed)
Ms. Spath, the neck x-rays do show some minor degenerative changes at the neck spine that can account for your symptoms noted in clinic. For now, continue the plan outlined during our visit and maintain follow-up. Please reach out for questions.

## 2020-09-24 ENCOUNTER — Other Ambulatory Visit: Payer: Self-pay

## 2020-09-24 ENCOUNTER — Ambulatory Visit: Payer: 59 | Admitting: Family Medicine

## 2020-09-24 ENCOUNTER — Encounter: Payer: Self-pay | Admitting: Family Medicine

## 2020-09-24 VITALS — BP 146/88 | HR 97 | Temp 97.9°F | Ht 65.0 in | Wt 219.0 lb

## 2020-09-24 DIAGNOSIS — M542 Cervicalgia: Secondary | ICD-10-CM | POA: Diagnosis not present

## 2020-09-24 DIAGNOSIS — M4722 Other spondylosis with radiculopathy, cervical region: Secondary | ICD-10-CM

## 2020-09-24 MED ORDER — DICLOFENAC SODIUM 50 MG PO TBEC: 50.0000 mg | DELAYED_RELEASE_TABLET | Freq: Two times a day (BID) | ORAL | 1 refills | Status: DC

## 2020-09-24 MED ORDER — CYCLOBENZAPRINE HCL 10 MG PO TABS: 10.0000 mg | ORAL_TABLET | Freq: Three times a day (TID) | ORAL | 1 refills | Status: DC | PRN

## 2020-09-24 NOTE — Assessment & Plan Note (Signed)
Patient with interval progress following pharmacotherapy and relative rest.  Recent radiographs show primary cervical etiology with right greater than left foraminal stenosis in the setting of multilevel mild spondylosis.  Her physical exam findings today show interval improvement, there is positive Spurling's on the right in isolation, minimal impingement of the right shoulder, rotator cuff testing is benign.  I have discussed the same with the patient as well as treatment strategies moving forward, our shared goal is maintenance of symptom control and gradual improvement.  To achieve this formal physical therapy was written for, medications are to continue on an as-needed basis, and if symptoms persist at the 6-week mark despite adherence to shared plan, advanced imaging and/or local trigger point injections at the paraspinal cervical musculature to be considered.  She can otherwise contact for questions and follow-up on an as-needed basis.

## 2020-09-24 NOTE — Progress Notes (Signed)
Primary Care / Sports Medicine Office Visit  Patient Information:  Patient ID: Natalie Osborne, female DOB: 1965-10-01 Age: 55 y.o. MRN: [id number]   Natalie Osborne is a pleasant 55 y.o. female presenting with the following:  Chief Complaint  Patient presents with   Follow-up   Cervicalgia    X-ray 09/10/20; taking diclofenac and cyclobenzaprine as directed with relief; has noticed tingling in right-handedness; 1/10 pain in office    Review of Systems pertinent details above   Patient Active Problem List   Diagnosis Date Noted   Cervical spondylosis with radiculopathy 09/24/2020   Cervicalgia 09/10/2020   Mixed hyperlipidemia 08/02/2020   Eczema of both hands 03/30/2017   Plantar fasciitis of right foot 03/30/2017   Tobacco dependency 03/30/2017   Menopausal and female climacteric states 03/30/2017   BMI 37.0-37.9, adult 03/30/2017   History reviewed. No pertinent past medical history. Outpatient Encounter Medications as of 09/24/2020  Medication Sig   Albuterol Sulfate (PROAIR RESPICLICK) 108 (90 Base) MCG/ACT AEPB Inhale 2 puffs into the lungs 4 (four) times daily as needed.   clobetasol cream (TEMOVATE) 0.05 % Apply 1 application topically 2 (two) times daily.   ibuprofen (ADVIL) 600 MG tablet Take 600 mg by mouth every 6 (six) hours as needed.   tazarotene (AVAGE) 0.1 % cream Apply 1 application topically in the morning and at bedtime.   [DISCONTINUED] diclofenac (VOLTAREN) 50 MG EC tablet Take 1 tablet (50 mg total) by mouth 2 (two) times daily.   cyclobenzaprine (FLEXERIL) 10 MG tablet Take 1 tablet (10 mg total) by mouth 3 (three) times daily as needed for muscle spasms.   diclofenac (VOLTAREN) 50 MG EC tablet Take 1 tablet (50 mg total) by mouth 2 (two) times daily.   [DISCONTINUED] cyclobenzaprine (FLEXERIL) 10 MG tablet Take 1 tablet (10 mg total) by mouth 3 (three) times daily as needed for muscle spasms. (Patient not taking: Reported on 09/24/2020)   No  facility-administered encounter medications on file as of 09/24/2020.   Past Surgical History:  Procedure Laterality Date   CATARACT EXTRACTION  2011   BILATERAL   KNEE SURGERY  2001   Right Knee   TUBAL LIGATION      Vitals:   09/24/20 1608  BP: (!) 146/88  Pulse: 97  Temp: 97.9 F (36.6 C)  SpO2: 99%   Vitals:   09/24/20 1608  Weight: 219 lb (99.3 kg)  Height: 5\' 5"  (1.651 m)   Body mass index is 36.44 kg/m.  DG Cervical Spine Complete  Result Date: 09/12/2020 CLINICAL DATA:  Chronic neck pain.  Right shoulder pain EXAM: CERVICAL SPINE - COMPLETE 4+ VIEW COMPARISON:  None. FINDINGS: Slight anterolisthesis C2-3 and C3-4 and C6-7. Negative for fracture or mass. Prevertebral soft tissues normal. Mild disc and mild facet degeneration in the cervical spine. Mild right foraminal narrowing C4-5 and C5-6 due to spurring. Mild left foraminal narrowing C3-4 and C4-5 and C5-6 due to spurring. IMPRESSION: Mild cervical spondylosis. Electronically Signed   By: 09/14/2020 M.D.   On: 09/12/2020 09:22     Independent interpretation of notes and tests performed by another provider:   Independent interpretation of cervical spine x-rays dated 09/10/2020 reveal overall preserved cervical lordosis, anterior endplate osteophyte presence at C5, facet arthropathy at C5-C6, subtle anterolisthesis of C3 on C4, C6 on C7, mild foraminal narrowing left greater than right, no acute osseous process identified  Procedures performed:   None  Pertinent History, Exam, Impression, and Recommendations:  Cervical spondylosis with radiculopathy Patient with interval progress following pharmacotherapy and relative rest.  Recent radiographs show primary cervical etiology with right greater than left foraminal stenosis in the setting of multilevel mild spondylosis.  Her physical exam findings today show interval improvement, there is positive Spurling's on the right in isolation, minimal impingement of the  right shoulder, rotator cuff testing is benign.  I have discussed the same with the patient as well as treatment strategies moving forward, our shared goal is maintenance of symptom control and gradual improvement.  To achieve this formal physical therapy was written for, medications are to continue on an as-needed basis, and if symptoms persist at the 6-week mark despite adherence to shared plan, advanced imaging and/or local trigger point injections at the paraspinal cervical musculature to be considered.  She can otherwise contact for questions and follow-up on an as-needed basis.    Orders & Medications Meds ordered this encounter  Medications   diclofenac (VOLTAREN) 50 MG EC tablet    Sig: Take 1 tablet (50 mg total) by mouth 2 (two) times daily.    Dispense:  60 tablet    Refill:  1   cyclobenzaprine (FLEXERIL) 10 MG tablet    Sig: Take 1 tablet (10 mg total) by mouth 3 (three) times daily as needed for muscle spasms.    Dispense:  60 tablet    Refill:  1   Orders Placed This Encounter  Procedures   Ambulatory referral to Physical Therapy     Return if symptoms worsen or fail to improve.     Jerrol Banana, MD   Primary Care Sports Medicine [facility]

## 2020-09-24 NOTE — Patient Instructions (Addendum)
-   Start physical therapy with referral provided - They will teach you home exercises to perform daily - Dose diclofenac and cyclobenzaprine on an as-needed basis - Contact us in 6 weeks for any lingering symptoms or for any questions between now and then  [facility]:  [place]:  [phone]  [place]: [phone]

## 2020-10-09 ENCOUNTER — Other Ambulatory Visit: Payer: Self-pay

## 2020-10-09 ENCOUNTER — Ambulatory Visit: Payer: 59 | Attending: Family Medicine | Admitting: Physical Therapy

## 2020-10-09 DIAGNOSIS — M6281 Muscle weakness (generalized): Secondary | ICD-10-CM | POA: Insufficient documentation

## 2020-10-09 DIAGNOSIS — M542 Cervicalgia: Secondary | ICD-10-CM | POA: Insufficient documentation

## 2020-10-09 DIAGNOSIS — M2560 Stiffness of unspecified joint, not elsewhere classified: Secondary | ICD-10-CM | POA: Insufficient documentation

## 2020-10-09 DIAGNOSIS — M5412 Radiculopathy, cervical region: Secondary | ICD-10-CM | POA: Insufficient documentation

## 2020-10-10 ENCOUNTER — Encounter: Payer: Self-pay | Admitting: Physical Therapy

## 2020-10-10 NOTE — Therapy (Signed)
[facility] [address]. [place], [place], [address] Phone: [phone]   Fax:  [phone]  Physical Therapy Evaluation  Patient Details  Name: Natalie Osborne MRN: [id number] Date of Birth: 06/14/1965 Referring Provider (PT): Joseph Berkshire. MD   Encounter Date: 10/09/2020   PT End of Session - 10/10/20 1347     Visit Number 1    Number of Visits 8    Date for PT Re-Evaluation 11/06/20    Authorization Time Period 1    Authorization - Visit Number 10    PT Start Time 1723    PT Stop Time 1824    PT Time Calculation (min) 61 min    Activity Tolerance Patient tolerated treatment well;No increased pain;Patient limited by pain    Behavior During Therapy [facility] for tasks assessed/performed             History reviewed. No pertinent past medical history.  Past Surgical History:  Procedure Laterality Date   CATARACT EXTRACTION  2011   BILATERAL   KNEE SURGERY  2001   Right Knee   TUBAL LIGATION      There were no vitals filed for this visit.    Subjective Assessment - 10/10/20 1333     Subjective Pt presents to session with chief complaint of neck pain since 6 months ago. Pt has experienced neck pain prior to this episode but none of them were as severe. Pt needs to take diclofenac to reduce pain to managable level. No severe trama and no injection so far. Medication is the only thing helps. No MRI.    Limitations House hold activities    How long can you sit comfortably? need change of position every 5 minutes    How long can you walk comfortably? not limited    Diagnostic tests X-ray: overall impression: decreased cervical vetebral space.    Currently in Pain? Yes    Pain Score 2    Worse pain without medication 8/10   Pain Location Neck    Pain Orientation Right    Pain Descriptors / Indicators Aching;Dull;Tingling    Pain Type Chronic pain    Pain Radiating Towards R arm and dorsal 1st/2nd fingers    Pain Onset More  than a month ago    Pain Frequency Intermittent    Aggravating Factors  standing, job tasks -- lifting shoulder and typing.    Pain Relieving Factors NSAIDs, muscle relaxers                  OBJECTIVE  Mental Status Patient is oriented to person, place and time.  Recent memory is intact.  Remote memory is intact.  Attention span and concentration are intact.  Expressive speech is intact.  Patient's fund of knowledge is within normal limits for educational level.  SENSATION: Grossly intact to light touch bilateral UE as determined by testing dermatomes C2-T2  Right C7/T1 shows decrease sensation to light touch.    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 (C5/6) and musculocutaneous n. (C5-7)) 4+/4+ Shoulder abduction (deltoid/supraspinatus, axillary/suprascapular n, C5) 4+/4+ Shoulder external rotation (infraspinatus/teres minor) 5/5 Shoulder internal rotation (subcapularis/lats/pec major) 5/5 Shoulder extension (posterior deltoid, lats, teres major, axillary/thoracodorsal n.) 4+/4+ Shoulder horizontal abduction  Cervical isometrics are strong in all directions exception R  rotation, limited by pain MMT 4/5;  AROM R/L 55 Cervical Flexion 34* Cervical Extension 45/20 Cervical Lateral Flexion 45*/85 Cervical Rotation *Indicates pain, overpressure performed unless otherwise indicated  PROM Same as AROM  Repeated Movements No centralization or peripheralization of symptoms with repeated cervical protraction and retraction.    Muscle Length Upper Trap: Levator:    Passive Accessory Intervertebral Motion (PAIVM) Pt denies reproduction of neck pain with CPA C2-T7 and UPA bilaterally C2-T7. Generally hypomobile throughout  Passive  Physiological Intervertebral Motion (PPIVM) Normal flexion and extension with PPIVM testing   SPECIAL TESTS Spurlings A (ipsilateral lateral flexion/axial compression): R: Negative L: Not examined Spurlings B (ipsilateral lateral flexion/contralateral rotation/axial compression): R: Negative L: Negative Distraction Test: Positive  ULTT Radial: R: Positive L: Negative        Objective measurements completed on examination: See above findings.          PT Long Term Goals - 10/10/20 1645       PT LONG TERM GOAL #1   Title Pt will be independent with HEP in order to improve ROM and decrease neck pain in order to improve pain-free function at home and work.    Baseline IE: 8/10 AROM  R/L  55 Cervical Flexion  34* Cervical Extension  45/20 Cervical Lateral Flexion  45*/85 Cervical Rotation    Time 4    Period Weeks    Status New    Target Date 11/07/20      PT LONG TERM GOAL #2   Title Pt will decrease worst neck pain as reported on NPRS by at least 2 points in order to demonstrate clinically significant reduction in back pain.    Baseline IE: worst pain without medication 8/10    Time 4    Period Weeks    Status New    Target Date 11/07/20      PT LONG TERM GOAL #3   Title Pt will increase strength of by at least 1/2 MMT grade in order to demonstrate improvement in strength and function.    Baseline IE: Strength  R/L  4+/4+ Shoulder flexion (anterior deltoid/pec major/coracobrachialis, axillary n. (C5/6) and musculocutaneous n. (C5-7))  4+/4+ Shoulder abduction (deltoid/supraspinatus, axillary/suprascapular n, C5)  4+/4+ Shoulder external rotation (infraspinatus/teres minor)  5/5 Shoulder internal rotation (subcapularis/lats/pec major)  5/5 Shoulder extension (posterior deltoid, lats, teres major, axillary/thoracodorsal n.)  4+/4+ Shoulder horizontal abduction    Cervical isometrics are strong in all directions exception R rotation, limited by pain MMT 4/5;    Time 4     Period Weeks    Status New    Target Date 11/07/20      PT LONG TERM GOAL #4   Title Pt will improve FOTO score to  to demonstrate ability of returning to functional ADL    Baseline IE: ?    Time 4    Period Weeks    Status New    Target Date 11/07/20                Plan - 10/10/20 1348     Clinical Impression Statement Pt is a pleasant 55 year-old female referred for neck pain. 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 R UT and LS were tender to palpate d/t muscle tightness. Pt will benefit from skilled PT services  to address deficits and return to pain-free function at home and work.    Examination-Activity Limitations Sleep;Locomotion Level;Lift    Examination-Participation Restrictions Driving;Occupation    Stability/Clinical Decision Making Stable/Uncomplicated    Clinical Decision Making Low    Rehab Potential Good    PT Frequency 2x / week    PT Duration 4 weeks    PT Treatment/Interventions Therapeutic exercise;Therapeutic activities;Functional mobility training;Neuromuscular re-education;Manual techniques;Passive range of motion    PT Home Exercise Plan defer to next visit    Consulted and Agree with Plan of Care Patient             Patient will benefit from skilled therapeutic intervention in order to improve the following deficits and impairments:  Decreased mobility, Decreased activity tolerance, Decreased range of motion, Decreased strength, Hypomobility  Visit Diagnosis: Cervicalgia  Decreased range of motion  Muscle weakness (generalized)     Problem List Patient Active Problem List   Diagnosis Date Noted   Cervical spondylosis with radiculopathy 09/24/2020   Cervicalgia 09/10/2020   Mixed hyperlipidemia 08/02/2020   Eczema of both hands  03/30/2017   Plantar fasciitis of right foot 03/30/2017   Tobacco dependency 03/30/2017   Menopausal and female climacteric states 03/30/2017   BMI 37.0-37.9, adult 03/30/2017   Cammie Mcgee, PT, DPT # [id number] Jenny Reichmann, SPT 10/11/2020, 8:06 AM  [facility] [address] [place], [place], [address] Phone: [phone]   Fax:  [phone]  Name: Natalie Osborne MRN: [id number] Date of Birth: [birth year]

## 2020-10-11 ENCOUNTER — Other Ambulatory Visit: Payer: Self-pay

## 2020-10-11 ENCOUNTER — Ambulatory Visit: Payer: 59 | Admitting: Physical Therapy

## 2020-10-11 ENCOUNTER — Encounter: Payer: Self-pay | Admitting: Physical Therapy

## 2020-10-11 DIAGNOSIS — M542 Cervicalgia: Secondary | ICD-10-CM

## 2020-10-11 DIAGNOSIS — M2560 Stiffness of unspecified joint, not elsewhere classified: Secondary | ICD-10-CM

## 2020-10-11 DIAGNOSIS — M6281 Muscle weakness (generalized): Secondary | ICD-10-CM

## 2020-10-11 NOTE — Patient Instructions (Signed)
Access Code: [email or web address] URL: [email or web address] Date: 10/11/2020 Prepared by: Dorene Grebe  Exercises Supine Cervical Retraction with Towel - 1 x daily - 7 x weekly - 1 sets - 10 reps - 10 seconds hold Seated Upper Trap Stretch - 2 x daily - 7 x weekly - 1 sets - 3 reps Gentle Levator Scapulae Stretch - 2 x daily - 7 x weekly - 1 sets - 3 reps Sternocleidomastoid Stretch - 2 x daily - 7 x weekly - 1 sets - 3 reps Scapular Retraction with Resistance - 1 x daily - 5 x weekly - 3 sets - 10 reps

## 2020-10-12 NOTE — Therapy (Addendum)
[facility] [address] [place], [place], [address] Phone: [phone]   Fax:  [phone]  Physical Therapy Treatment  Patient Details  Name: Natalie Osborne MRN: [id number] Date of Birth: [birth year] Referring Provider (PT): Joseph Berkshire. MD   Encounter Date: 10/11/2020    PT End of Session - 10/11/20 1748     Visit Number 2    Number of Visits 8    Date for PT Re-Evaluation 11/06/20    Authorization Time Period 2    Authorization - Visit Number 10    PT Start Time 1726    PT Stop Time 1815    PT Time Calculation (min) 49 min    Activity Tolerance Patient tolerated treatment well;No increased pain;Patient limited by pain    Behavior During Therapy [facility] for tasks assessed/performed              History reviewed. No pertinent past medical history.  Past Surgical History:  Procedure Laterality Date   CATARACT EXTRACTION  2011   BILATERAL   KNEE SURGERY  2001   Right Knee   TUBAL LIGATION      There were no vitals filed for this visit.   Subjective Assessment - 10/16/20 1229     Subjective Pt. reports 3/10 neck pain prior to tx session.  Pt. reports compliance with HEP.  No new complaints noted.    Limitations House hold activities    How long can you sit comfortably? need change of position every 5 minutes    How long can you walk comfortably? not limited    Diagnostic tests X-ray: overall impression: decreased cervical vetebral space.    Currently in Pain? Yes    Pain Score 3     Pain Location Neck    Pain Orientation Right    Pain Descriptors / Indicators Aching;Dull;Tingling    Pain Type Chronic pain    Pain Onset More than a month ago             Ther.ex.:  Supine Cervical Retraction with Towel - 1 x daily - 7 x weekly - 1 sets - 10 reps - 10 seconds hold Seated Upper Trap Stretch - 2 x daily - 7 x weekly - 1 sets - 3 reps Gentle Levator Scapulae Stretch - 2 x daily - 7 x weekly - 1 sets - 3  reps Sternocleidomastoid Stretch - 2 x daily - 7 x weekly - 1 sets - 3 reps Scapular Retraction with Resistance - 1 x daily - 5 x weekly - 3 sets - 10 reps  (issued new copy of HEP).  Manual tx.:  Supine cervical traction on blue mat table 5x with static holds as tolerated  Supine L/R UT and levator manual stretches/ STM (as tolerated)  Use of tennis ball to R upper back in standing at wall (issued for HEP)         PT Long Term Goals - 10/10/20 1645       PT LONG TERM GOAL #1   Title Pt will be independent with HEP in order to improve ROM and decrease neck pain in order to improve pain-free function at home and work.    Baseline IE: 8/10 AROM  R/L  55 Cervical Flexion  34* Cervical Extension  45/20 Cervical Lateral Flexion  45*/85 Cervical Rotation    Time 4    Period Weeks    Status New    Target Date 11/07/20  PT LONG TERM GOAL #2   Title Pt will decrease worst neck pain as reported on NPRS by at least 2 points in order to demonstrate clinically significant reduction in back pain.    Baseline IE: worst pain without medication 8/10    Time 4    Period Weeks    Status New    Target Date 11/07/20      PT LONG TERM GOAL #3   Title Pt will increase strength of by at least 1/2 MMT grade in order to demonstrate improvement in strength and function.    Baseline IE: Strength  R/L  4+/4+ Shoulder flexion (anterior deltoid/pec major/coracobrachialis, axillary n. (C5/6) and musculocutaneous n. (C5-7))  4+/4+ Shoulder abduction (deltoid/supraspinatus, axillary/suprascapular n, C5)  4+/4+ Shoulder external rotation (infraspinatus/teres minor)  5/5 Shoulder internal rotation (subcapularis/lats/pec major)  5/5 Shoulder extension (posterior deltoid, lats, teres major, axillary/thoracodorsal n.)  4+/4+ Shoulder horizontal abduction    Cervical isometrics are strong in all directions exception R rotation, limited by pain MMT 4/5;    Time 4    Period Weeks    Status New    Target Date  11/07/20      PT LONG TERM GOAL #4   Title Pt will improve FOTO score to  to demonstrate ability of returning to functional ADL    Baseline IE: ?    Time 4    Period Weeks    Status New    Target Date 11/07/20              FOTO: initial 53/ goal 65.  Pt. requires minimal cuing for proper technique/form with HEP and use of tennis ball to R upper back at wall.  Good overall tx. tolerance with manual cervical traction/ UT stretches with statich holds.  Pt. reports no pain in neck after tx. session.  No change to HEP at this time and pt. instructed to contact PT if any questions or concerns with current program.       Patient will benefit from skilled therapeutic intervention in order to improve the following deficits and impairments:  Decreased mobility, Decreased activity tolerance, Decreased range of motion, Decreased strength, Hypomobility  Visit Diagnosis: Cervicalgia  Decreased range of motion  Muscle weakness (generalized)     Problem List Patient Active Problem List   Diagnosis Date Noted   Cervical spondylosis with radiculopathy 09/24/2020   Cervicalgia 09/10/2020   Mixed hyperlipidemia 08/02/2020   Eczema of both hands 03/30/2017   Plantar fasciitis of right foot 03/30/2017   Tobacco dependency 03/30/2017   Menopausal and female climacteric states 03/30/2017   BMI 37.0-37.9, adult 03/30/2017   Cammie Mcgee, PT, DPT # [id number] Jenny Reichmann, SPT 10/16/2020, 1:14 PM  [facility] [address] [place], [place], [address] Phone: [phone]   Fax:  [phone]  Name: Natalie Osborne MRN: [id number] Date of Birth: [birth year]

## 2020-10-16 ENCOUNTER — Ambulatory Visit: Payer: 59 | Admitting: Physical Therapy

## 2020-10-16 ENCOUNTER — Other Ambulatory Visit: Payer: Self-pay

## 2020-10-16 DIAGNOSIS — M542 Cervicalgia: Secondary | ICD-10-CM

## 2020-10-16 DIAGNOSIS — M2560 Stiffness of unspecified joint, not elsewhere classified: Secondary | ICD-10-CM

## 2020-10-16 DIAGNOSIS — M6281 Muscle weakness (generalized): Secondary | ICD-10-CM

## 2020-10-16 DIAGNOSIS — M5412 Radiculopathy, cervical region: Secondary | ICD-10-CM

## 2020-10-16 NOTE — Therapy (Signed)
[facility] [address] [place], [place], [address] Phone: [phone]   Fax:  [phone]  Physical Therapy Treatment  Patient Details  Name: Natalie Osborne MRN: [id number] Date of Birth: Nov 26, 1965 Referring Provider (PT): Joseph Berkshire. MD   Encounter Date: 10/16/2020   PT End of Session - 10/16/20 1858     Visit Number 3    Number of Visits 8    Date for PT Re-Evaluation 11/06/20    Authorization Time Period 3    Authorization - Visit Number 10    PT Start Time 1718    PT Stop Time 1800    PT Time Calculation (min) 42 min    Activity Tolerance Patient tolerated treatment well;No increased pain;Patient limited by pain    Behavior During Therapy [facility] for tasks assessed/performed             History reviewed. No pertinent past medical history.  Past Surgical History:  Procedure Laterality Date   CATARACT EXTRACTION  2011   BILATERAL   KNEE SURGERY  2001   Right Knee   TUBAL LIGATION      There were no vitals filed for this visit.   Subjective Assessment - 10/16/20 1859     Subjective No pain reported throughout the day without taking pain medication. Pt has been 50% consistant with HEP.    Limitations House hold activities    How long can you sit comfortably? need change of position every 5 minutes    How long can you walk comfortably? not limited    Diagnostic tests X-ray: overall impression: decreased cervical vetebral space.    Patient Stated Goals Decrease neck pain    Currently in Pain? No/denies    Pain Onset More than a month ago             Treatment:  Manual tx.:   Supine suboccipital release on blue mat table 10x minutes with static holds as tolerated   Supine L/R UT and levator manual stretches/ STM (as tolerated)    Therex: (educated and updated to HEP)  1)Radial Nerve Flossing - 3 sets - 20 reps   2)repetitive R side bending 3x20     PT Long Term Goals - 10/10/20 1645        PT LONG TERM GOAL #1   Title Pt will be independent with HEP in order to improve ROM and decrease neck pain in order to improve pain-free function at home and work.    Baseline IE: 8/10 AROM  R/L  55 Cervical Flexion  34* Cervical Extension  45/20 Cervical Lateral Flexion  45*/85 Cervical Rotation    Time 4    Period Weeks    Status New    Target Date 11/07/20      PT LONG TERM GOAL #2   Title Pt will decrease worst neck pain as reported on NPRS by at least 2 points in order to demonstrate clinically significant reduction in back pain.    Baseline IE: worst pain without medication 8/10    Time 4    Period Weeks    Status New    Target Date 11/07/20      PT LONG TERM GOAL #3   Title Pt will increase strength of by at least 1/2 MMT grade in order to demonstrate improvement in strength and function.    Baseline IE: Strength  R/L  4+/4+ Shoulder flexion (anterior deltoid/pec major/coracobrachialis, axillary n. (C5/6) and musculocutaneous n. (C5-7))  4+/4+ Shoulder abduction (deltoid/supraspinatus, axillary/suprascapular n, C5)  4+/4+ Shoulder external rotation (infraspinatus/teres minor)  5/5 Shoulder internal rotation (subcapularis/lats/pec major)  5/5 Shoulder extension (posterior deltoid, lats, teres major, axillary/thoracodorsal n.)  4+/4+ Shoulder horizontal abduction    Cervical isometrics are strong in all directions exception R rotation, limited by pain MMT 4/5;    Time 4    Period Weeks    Status New    Target Date 11/07/20      PT LONG TERM GOAL #4   Title Pt will improve FOTO score to  to demonstrate ability of returning to functional ADL    Baseline IE: ?    Time 4    Period Weeks    Status New    Target Date 11/07/20                   Plan - 10/17/20 0829     Clinical Impression Statement Pt presents to session reporting no pain in the neck, slight tingling and numbness in the hand. Session focused on manual therapy to facilitate soft tissue mobility and  providing nerve flossing thera/ex to manage tingling sensation. Further treatment will focus on postural strengthening and radicular symptom reduction.    Examination-Activity Limitations Sleep;Locomotion Level;Lift    Examination-Participation Restrictions Driving;Occupation    Stability/Clinical Decision Making Stable/Uncomplicated    Clinical Decision Making Low    Rehab Potential Good    PT Duration 4 weeks    PT Treatment/Interventions Therapeutic exercise;Therapeutic activities;Functional mobility training;Neuromuscular re-education;Manual techniques;Passive range of motion    PT Next Visit Plan Reassess cervical AROM    PT Home Exercise Plan Access Code: [email or web address]             Patient will benefit from skilled therapeutic intervention in order to improve the following deficits and impairments:  Decreased mobility, Decreased activity tolerance, Decreased range of motion, Decreased strength, Hypomobility  Visit Diagnosis: Cervicalgia  Decreased range of motion  Muscle weakness (generalized)  Cervical radicular pain     Problem List Patient Active Problem List   Diagnosis Date Noted   Cervical spondylosis with radiculopathy 09/24/2020   Cervicalgia 09/10/2020   Mixed hyperlipidemia 08/02/2020   Eczema of both hands 03/30/2017   Plantar fasciitis of right foot 03/30/2017   Tobacco dependency 03/30/2017   Menopausal and female climacteric states 03/30/2017   BMI 37.0-37.9, adult 03/30/2017   Cammie Mcgee, PT, DPT # [id number] Jenny Reichmann, SPT 10/17/2020, 9:12 AM  [facility] [address] [place], [place], [address] Phone: [phone]   Fax:  [phone]  Name: Natalie Osborne MRN: [id number] Date of Birth: 1965/09/23

## 2020-10-17 ENCOUNTER — Encounter: Payer: Self-pay | Admitting: Physical Therapy

## 2020-10-18 ENCOUNTER — Encounter: Payer: Self-pay | Admitting: Physical Therapy

## 2020-10-18 ENCOUNTER — Ambulatory Visit: Payer: 59 | Admitting: Physical Therapy

## 2020-10-18 ENCOUNTER — Other Ambulatory Visit: Payer: Self-pay

## 2020-10-18 DIAGNOSIS — M542 Cervicalgia: Secondary | ICD-10-CM | POA: Diagnosis not present

## 2020-10-18 DIAGNOSIS — M2560 Stiffness of unspecified joint, not elsewhere classified: Secondary | ICD-10-CM

## 2020-10-18 DIAGNOSIS — M6281 Muscle weakness (generalized): Secondary | ICD-10-CM

## 2020-10-18 DIAGNOSIS — M5412 Radiculopathy, cervical region: Secondary | ICD-10-CM

## 2020-10-18 NOTE — Therapy (Signed)
[facility] [address] [place], [place], [address] Phone: [phone]   Fax:  [phone]  Physical Therapy Treatment  Patient Details  Name: MODESTINE SCHERZINGER MRN: [id number] Date of Birth: 26-Jun-1965 Referring Provider (PT): Rosette Reveal. MD   Encounter Date: 10/18/2020   PT End of Session - 10/18/20 1349     Visit Number 4    Number of Visits 8    Date for PT Re-Evaluation 11/06/20    Authorization Time Period 4    Authorization - Visit Number 10    PT Start Time 1300    PT Stop Time 1340    PT Time Calculation (min) 40 min    Activity Tolerance Patient tolerated treatment well;No increased pain;Patient limited by pain    Behavior During Therapy [facility] for tasks assessed/performed             History reviewed. No pertinent past medical history.  Past Surgical History:  Procedure Laterality Date   CATARACT EXTRACTION  2011   BILATERAL   KNEE SURGERY  2001   Right Knee   TUBAL LIGATION      There were no vitals filed for this visit.   Subjective Assessment - 10/18/20 1347     Subjective Pt reports no pain in neck since last session; slept well throughout the night. Pt has been consistent with HEP.    Limitations House hold activities    How long can you sit comfortably? need change of position every 5 minutes    How long can you walk comfortably? not limited    Diagnostic tests X-ray: overall impression: decreased cervical vetebral space.    Patient Stated Goals Decrease neck pain    Currently in Pain? No/denies    Pain Onset More than a month ago            Cervical rotation:  R 67 degree, L 80 degree.    Treatment:   Therex:  1)Seated Assisted Cervical Rotation with Towel -  2 sets - 10 reps. Verbal cue and visual demonstration required. Pt also educated on performing self MET with towel. Pt understood with returned demonstration.   2)Corner Pec Major Stretch - 2 sets - 10 reps - 30s hold  Nautilus:    1)Lat pull down 30# 2x10. Pt required verbal and tactile to facilitate scapular depression.  Manual tx.:   Supine suboccipital release on blue mat table 10x minutes with static holds as tolerated   Supine L/R UT and levator manual stretches/ STM (as tolerated)     PT Long Term Goals - 10/10/20 1645       PT LONG TERM GOAL #1   Title Pt will be independent with HEP in order to improve ROM and decrease neck pain in order to improve pain-free function at home and work.    Baseline IE: 8/10 AROM  R/L  55 Cervical Flexion  34* Cervical Extension  45/20 Cervical Lateral Flexion  45*/85 Cervical Rotation    Time 4    Period Weeks    Status New    Target Date 11/07/20      PT LONG TERM GOAL #2   Title Pt will decrease worst neck pain as reported on NPRS by at least 2 points in order to demonstrate clinically significant reduction in back pain.    Baseline IE: worst pain without medication 8/10    Time 4    Period Weeks    Status New  Target Date 11/07/20      PT LONG TERM GOAL #3   Title Pt will increase strength of by at least 1/2 MMT grade in order to demonstrate improvement in strength and function.    Baseline IE: Strength  R/L  4+/4+ Shoulder flexion (anterior deltoid/pec major/coracobrachialis, axillary n. (C5/6) and musculocutaneous n. (C5-7))  4+/4+ Shoulder abduction (deltoid/supraspinatus, axillary/suprascapular n, C5)  4+/4+ Shoulder external rotation (infraspinatus/teres minor)  5/5 Shoulder internal rotation (subcapularis/lats/pec major)  5/5 Shoulder extension (posterior deltoid, lats, teres major, axillary/thoracodorsal n.)  4+/4+ Shoulder horizontal abduction    Cervical isometrics are strong in all directions exception R rotation, limited by pain MMT 4/5;    Time 4    Period Weeks    Status New    Target Date 11/07/20      PT LONG TERM GOAL #4   Title Pt will improve FOTO score to  to demonstrate ability of returning to functional ADL    Baseline IE: ?    Time 4     Period Weeks    Status New    Target Date 11/07/20                   Plan - 10/18/20 1402     Clinical Impression Statement Session focused on postural strengthening and education along with manual therapy. Pt educated on seating/standing cervical retraction at work. Pt also educated on performing self MET with towel. Pt understood with returned demonstration. Pt and PT agreed on potential discharge planning next session.    Examination-Activity Limitations Sleep;Locomotion Level;Lift    Examination-Participation Restrictions Driving;Occupation    Stability/Clinical Decision Making Stable/Uncomplicated    Clinical Decision Making Low    Rehab Potential Good    PT Duration 4 weeks    PT Treatment/Interventions Therapeutic exercise;Therapeutic activities;Functional mobility training;Neuromuscular re-education;Manual techniques;Passive range of motion    PT Next Visit Plan Dx    PT Home Exercise Plan Access Code: [email or web address]    Consulted and Agree with Plan of Care Patient             Patient will benefit from skilled therapeutic intervention in order to improve the following deficits and impairments:  Decreased mobility, Decreased activity tolerance, Decreased range of motion, Decreased strength, Hypomobility  Visit Diagnosis: Cervicalgia  Decreased range of motion  Muscle weakness (generalized)  Cervical radicular pain     Problem List Patient Active Problem List   Diagnosis Date Noted   Cervical spondylosis with radiculopathy 09/24/2020   Cervicalgia 09/10/2020   Mixed hyperlipidemia 08/02/2020   Eczema of both hands 03/30/2017   Plantar fasciitis of right foot 03/30/2017   Tobacco dependency 03/30/2017   Menopausal and female climacteric states 03/30/2017   BMI 37.0-37.9, adult 03/30/2017   Pura Spice, PT, DPT # [id number] Shirley Friar, SPT 10/18/2020, 3:28 PM  [facility] [address] [place],  [place], [address] Phone: [phone]   Fax:  [phone]  Name: CHAYIL GANTT MRN: [id number] Date of Birth: 01-15-1966

## 2020-10-23 ENCOUNTER — Encounter: Payer: 59 | Admitting: Physical Therapy

## 2020-10-25 ENCOUNTER — Encounter: Payer: 59 | Admitting: Physical Therapy

## 2020-10-30 ENCOUNTER — Encounter: Payer: 59 | Admitting: Physical Therapy

## 2020-11-01 ENCOUNTER — Encounter: Payer: 59 | Admitting: Physical Therapy

## 2020-11-06 ENCOUNTER — Encounter: Payer: 59 | Admitting: Physical Therapy

## 2020-11-08 ENCOUNTER — Encounter: Payer: 59 | Admitting: Physical Therapy

## 2020-11-08 ENCOUNTER — Encounter: Payer: Self-pay | Admitting: Physical Therapy

## 2020-11-15 ENCOUNTER — Ambulatory Visit: Payer: Self-pay

## 2020-11-15 NOTE — Telephone Encounter (Signed)
Patient called and says she's been having off and on left jaw pain that extends to her left upper chest under the collar bone. She says that there is no pain at this time. She says she spoke to Dr. Chauncey Reading, Physical Therapist about it and he suggested she see Dr. Judithann Graves, since this is the similar pain she was having months ago on the right side. She says the pain feels sharp when it happens at a 7. She says she initially thought it was an abscess, but the dentist ruled that out. She says she would like to be seen tomorrow because she will be back at work for a 7 day stretch and will not be able to ask off due to her being off this week for a wedding. I called the office to ask if it's ok to schedule in Dr. Karn Cassis slot for Same Day/MyChart/Virtual/DT Virtual, but no one answered the line after several minutes holding. Appointment scheduled for tomorrow at 1140 with Dr. Judithann Graves, care advice given, patient verbalized understanding.  Reason for Disposition  [1] Sharp severe pain(s) AND [2] lasting for seconds to minutes AND [3] now gone  Answer Assessment - Initial Assessment Questions 1. ONSET: "When did the pain start?" (e.g., minutes, hours, days)     Couple of weeks becoming stronger and more frequent 2. ONSET: "Does the pain come and go, or has it been constant since it started?" (e.g., constant, intermittent, fleeting)     Yes it does, no pain now 3. SEVERITY: "How bad is the pain?"   (Scale 1-10; mild, moderate or severe)   - MILD (1-3): doesn't interfere with normal activities    - MODERATE (4-7): interferes with normal activities or awakens from sleep    - SEVERE (8-10): excruciating pain, unable to do any normal activities      No pain at present, sometimes not that bad, other times a 7 4. LOCATION: "Where does it hurt?"      Left jaw to the upper chest below the collar bone 5. RASH: "Is there any redness, rash, or swelling of the face?"     No 6. FEVER: "Do you have a fever?" If Yes,  ask: "What is it, how was it measured, and when did it start?"      No 7. OTHER SYMPTOMS: "Do you have any other symptoms?" (e.g., fever, toothache, nasal discharge, nasal congestion, clicking sensation in jaw joint)     No 8. PREGNANCY: "Is there any chance you are pregnant?" "When was your last menstrual period?"     No  Protocols used: Face Pain-A-AH

## 2020-11-16 ENCOUNTER — Ambulatory Visit (INDEPENDENT_AMBULATORY_CARE_PROVIDER_SITE_OTHER): Payer: 59 | Admitting: Internal Medicine

## 2020-11-16 ENCOUNTER — Ambulatory Visit
Admission: RE | Admit: 2020-11-16 | Discharge: 2020-11-16 | Disposition: A | Discharge status: Home / Self Care | Payer: 59 | Attending: Internal Medicine | Admitting: Internal Medicine

## 2020-11-16 ENCOUNTER — Encounter: Payer: Self-pay | Admitting: Internal Medicine

## 2020-11-16 ENCOUNTER — Ambulatory Visit
Admission: RE | Admit: 2020-11-16 | Discharge: 2020-11-16 | Disposition: A | Discharge status: Home / Self Care | Payer: 59 | Source: Ambulatory Visit | Attending: Internal Medicine | Admitting: Internal Medicine

## 2020-11-16 ENCOUNTER — Other Ambulatory Visit: Payer: Self-pay

## 2020-11-16 VITALS — BP 150/105 | HR 88 | Temp 98.1°F | Ht 65.0 in | Wt 215.0 lb

## 2020-11-16 DIAGNOSIS — R6884 Jaw pain: Secondary | ICD-10-CM | POA: Insufficient documentation

## 2020-11-16 DIAGNOSIS — R030 Elevated blood-pressure reading, without diagnosis of hypertension: Secondary | ICD-10-CM | POA: Diagnosis not present

## 2020-11-16 DIAGNOSIS — M26622 Arthralgia of left temporomandibular joint: Secondary | ICD-10-CM | POA: Insufficient documentation

## 2020-11-16 DIAGNOSIS — R0789 Other chest pain: Secondary | ICD-10-CM | POA: Insufficient documentation

## 2020-11-16 MED ORDER — LISINOPRIL 10 MG PO TABS: 10.0000 mg | ORAL_TABLET | Freq: Every day | ORAL | 1 refills | Status: DC

## 2020-11-16 NOTE — Progress Notes (Signed)
Date:  11/16/2020   Name:  Natalie Osborne   DOB:  Aug 04, 1965   MRN:  [id number]   Chief Complaint: Jaw Pain (Left jaw, goes to upper chest, went to dentist didn't find any thing wrong, always at the same time as when she gets a pain in chest, released from PT, comes and goes, 2 separate pains, does not feel thru neck )  Mouth InjuryPrimary symptoms do not include dental injury, headaches, fever, shortness of breath or sore throat. Episode onset: gradually worsening over the past 2 weeks. The symptoms are worsening. Episode frequency: multiple episodes per day.  Additional symptoms include: jaw pain. Additional symptoms do not include: facial swelling, ear pain and fatigue. Medical issues include: smoking.   Chest Pain  This is a recurrent problem. The current episode started 1 to 4 weeks ago. Episode frequency: has left upper chest pain when the left jaw hurts. Pertinent negatives include no fever, headaches, palpitations or shortness of breath.  Her past medical history is significant for hypertension.  Hypertension This is a recurrent problem. The problem is unchanged. Associated symptoms include chest pain. Pertinent negatives include no headaches, palpitations or shortness of breath. There are no associated agents to hypertension. Risk factors for coronary artery disease include smoking/tobacco exposure. Past treatments include nothing.   Lab Results  Component Value Date   CREATININE 0.73 08/01/2020   BUN 11 08/01/2020   NA 140 08/01/2020   K 4.9 08/01/2020   CL 101 08/01/2020   CO2 23 08/01/2020   Lab Results  Component Value Date   CHOL 247 (H) 08/01/2020   HDL 77 08/01/2020   LDLCALC 149 (H) 08/01/2020   TRIG 123 08/01/2020   CHOLHDL 3.2 08/01/2020   Lab Results  Component Value Date   TSH 1.490 08/01/2020   Lab Results  Component Value Date   HGBA1C 5.8 (H) 06/26/2017   Lab Results  Component Value Date   WBC 5.9 08/01/2020   HGB 14.2 08/01/2020   HCT 42.4  08/01/2020   MCV 92 08/01/2020   PLT 231 08/01/2020   Lab Results  Component Value Date   ALT 13 08/01/2020   AST 16 08/01/2020   ALKPHOS 85 08/01/2020   BILITOT 0.4 08/01/2020     Review of Systems  Constitutional:  Negative for chills, fatigue and fever.  HENT:  Negative for ear pain, facial swelling and sore throat.        Left lower jaw pain from ear to chin - sharp and severe at times; possibly relieved by voltaren oral and flexeril.  Respiratory:  Negative for chest tightness, shortness of breath and wheezing.   Cardiovascular:  Positive for chest pain. Negative for palpitations and leg swelling.  Neurological:  Negative for headaches.   Patient Active Problem List   Diagnosis Date Noted   Cervical spondylosis with radiculopathy 09/24/2020   Cervicalgia 09/10/2020   Mixed hyperlipidemia 08/02/2020   Eczema of both hands 03/30/2017   Plantar fasciitis of right foot 03/30/2017   Tobacco dependency 03/30/2017   Menopausal and female climacteric states 03/30/2017   BMI 37.0-37.9, adult 03/30/2017    Allergies  Allergen Reactions   Penicillins Rash and Other (See Comments)    Reaction as a kid.     Past Surgical History:  Procedure Laterality Date   CATARACT EXTRACTION  2011   BILATERAL   KNEE SURGERY  2001   Right Knee   TUBAL LIGATION      Social History  Tobacco Use   Smoking status: Every Day    Packs/day: 1.00    Years: 42.00    Pack years: 42.00    Types: Cigarettes   Smokeless tobacco: Never  Vaping Use   Vaping Use: Never used  Substance Use Topics   Alcohol use: Yes    Alcohol/week: 1.0 standard drink    Types: 1 Shots of liquor per week   Drug use: Never     Medication list has been reviewed and updated.  Current Meds  Medication Sig   Albuterol Sulfate (PROAIR RESPICLICK) 108 (90 Base) MCG/ACT AEPB Inhale 2 puffs into the lungs 4 (four) times daily as needed.   clobetasol cream (TEMOVATE) 0.05 % Apply 1 application topically 2 (two)  times daily.   cyclobenzaprine (FLEXERIL) 10 MG tablet Take 1 tablet (10 mg total) by mouth 3 (three) times daily as needed for muscle spasms.   tazarotene (AVAGE) 0.1 % cream Apply 1 application topically in the morning and at bedtime.   [DISCONTINUED] diclofenac (VOLTAREN) 50 MG EC tablet Take 1 tablet (50 mg total) by mouth 2 (two) times daily.   [DISCONTINUED] ibuprofen (ADVIL) 600 MG tablet Take 600 mg by mouth every 6 (six) hours as needed.    PHQ 2/9 Scores 11/16/2020 09/24/2020 09/10/2020 08/01/2020  PHQ - 2 Score 0 0 0 0  PHQ- 9 Score 3 0 1 2    GAD 7 : Generalized Anxiety Score 11/16/2020 09/24/2020 09/10/2020 08/01/2020  Nervous, Anxious, on Edge 1 0 0 0  Control/stop worrying 0 0 0 0  Worry too much - different things 0 0 0 0  Trouble relaxing 0 0 0 0  Restless 0 0 0 0  Easily annoyed or irritable 0 0 0 0  Afraid - awful might happen 0 0 0 0  Total GAD 7 Score 1 0 0 0  Anxiety Difficulty - Not difficult at all Not difficult at all Not difficult at all    BP Readings from Last 3 Encounters:  11/16/20 (!) 150/105  09/24/20 (!) 146/88  09/10/20 (!) 158/92    Physical Exam Constitutional:      Appearance: Normal appearance.  HENT:     Head:     Jaw: Malocclusion (left sided click) present.     Right Ear: Tympanic membrane and ear canal normal.     Left Ear: Tympanic membrane and ear canal normal.     Nose:     Right Sinus: No maxillary sinus tenderness.     Left Sinus: No maxillary sinus tenderness.     Mouth/Throat:     Pharynx: Oropharynx is clear.  Cardiovascular:     Rate and Rhythm: Normal rate and regular rhythm.     Pulses: Normal pulses.  Pulmonary:     Effort: Pulmonary effort is normal.     Breath sounds: Normal breath sounds. No wheezing or rhonchi.  Musculoskeletal:     Cervical back: Normal range of motion.  Lymphadenopathy:     Cervical: Cervical adenopathy (mild left submental fullness) present.  Skin:    General: Skin is warm and dry.   Neurological:     Mental Status: She is alert.    Wt Readings from Last 3 Encounters:  11/16/20 215 lb (97.5 kg)  09/24/20 219 lb (99.3 kg)  09/10/20 210 lb (95.3 kg)    BP (!) 150/105   Pulse (!) 108   Temp 98.1 F (36.7 C) (Oral)   Ht 5\' 5"  (1.651 m)   Wt 215  lb (97.5 kg)   SpO2 98%   BMI 35.78 kg/m   Assessment and Plan: 1. Pain in lower jaw TMJ click suggests possible radiated pain. Could also be neuropathic pain. Mild left sided fullness noted - will get imaging and further evaluation if needed by ENT Trial of Flexeril at night; continue tylenol PRN Consider Carbamazepine trial? - DG Neck Soft Tissue  2. Chest pain, atypical Likely radiated from the jaw since it occurs simultaneously. However, BP is elevated - EKG 12-Lead - NSR @ 87, WNL  3. Elevated blood pressure reading Need to begin treatment Follow up in one month. - lisinopril (ZESTRIL) 10 MG tablet; Take 1 tablet (10 mg total) by mouth daily.  Dispense: 30 tablet; Refill: 1   Partially dictated using Animal nutritionist. Any errors are unintentional.  Bari Edward, MD [facility]  11/16/2020

## 2020-11-16 NOTE — Patient Instructions (Signed)
Take Flexeril every evening

## 2020-12-18 ENCOUNTER — Ambulatory Visit: Payer: 59 | Admitting: Internal Medicine

## 2020-12-19 ENCOUNTER — Ambulatory Visit: Payer: 59 | Admitting: Internal Medicine

## 2020-12-19 ENCOUNTER — Other Ambulatory Visit: Payer: Self-pay

## 2020-12-19 ENCOUNTER — Encounter: Payer: Self-pay | Admitting: Internal Medicine

## 2020-12-19 VITALS — BP 148/98 | HR 100 | Ht 65.0 in | Wt 218.4 lb

## 2020-12-19 DIAGNOSIS — I10 Essential (primary) hypertension: Secondary | ICD-10-CM | POA: Diagnosis not present

## 2020-12-19 DIAGNOSIS — M26622 Arthralgia of left temporomandibular joint: Secondary | ICD-10-CM

## 2020-12-19 MED ORDER — OLMESARTAN MEDOXOMIL-HCTZ 40-25 MG PO TABS: 1.0000 | ORAL_TABLET | Freq: Every day | ORAL | 1 refills | Status: DC

## 2020-12-19 NOTE — Progress Notes (Signed)
Date:  12/19/2020   Name:  Natalie Osborne   DOB:  12/22/1965   MRN:  [id number]   Chief Complaint: Hypertension  Hypertension This is a chronic problem. The current episode started more than 1 month ago. The problem has been gradually improving since onset. Pertinent negatives include no chest pain or shortness of breath. Risk factors for coronary artery disease include smoking/tobacco exposure. Past treatments include ACE inhibitors. The current treatment provides moderate improvement. There are no compliance problems.    Lab Results  Component Value Date   CREATININE 0.73 08/01/2020   BUN 11 08/01/2020   NA 140 08/01/2020   K 4.9 08/01/2020   CL 101 08/01/2020   CO2 23 08/01/2020   Lab Results  Component Value Date   CHOL 247 (H) 08/01/2020   HDL 77 08/01/2020   LDLCALC 149 (H) 08/01/2020   TRIG 123 08/01/2020   CHOLHDL 3.2 08/01/2020   Lab Results  Component Value Date   TSH 1.490 08/01/2020   Lab Results  Component Value Date   HGBA1C 5.8 (H) 06/26/2017   Lab Results  Component Value Date   WBC 5.9 08/01/2020   HGB 14.2 08/01/2020   HCT 42.4 08/01/2020   MCV 92 08/01/2020   PLT 231 08/01/2020   Lab Results  Component Value Date   ALT 13 08/01/2020   AST 16 08/01/2020   ALKPHOS 85 08/01/2020   BILITOT 0.4 08/01/2020     Review of Systems  Constitutional:  Negative for chills, fatigue and fever.  Respiratory:  Positive for cough. Negative for chest tightness, shortness of breath and wheezing.   Cardiovascular:  Positive for leg swelling. Negative for chest pain.  Psychiatric/Behavioral:  Negative for dysphoric mood. The patient is not nervous/anxious.    Patient Active Problem List   Diagnosis Date Noted   Pain in lower jaw 11/16/2020   Chest pain, atypical 11/16/2020   Elevated blood pressure reading 11/16/2020   Cervical spondylosis with radiculopathy 09/24/2020   Cervicalgia 09/10/2020   Mixed hyperlipidemia 08/02/2020   Eczema of both hands  03/30/2017   Plantar fasciitis of right foot 03/30/2017   Tobacco dependency 03/30/2017   Menopausal and female climacteric states 03/30/2017   BMI 37.0-37.9, adult 03/30/2017    Allergies  Allergen Reactions   Penicillins Rash and Other (See Comments)    Reaction as a kid.     Past Surgical History:  Procedure Laterality Date   CATARACT EXTRACTION  2011   BILATERAL   KNEE SURGERY  2001   Right Knee   TUBAL LIGATION      Social History   Tobacco Use   Smoking status: Every Day    Packs/day: 1.00    Years: 42.00    Pack years: 42.00    Types: Cigarettes   Smokeless tobacco: Never  Vaping Use   Vaping Use: Never used  Substance Use Topics   Alcohol use: Yes    Alcohol/week: 1.0 standard drink    Types: 1 Shots of liquor per week   Drug use: Never     Medication list has been reviewed and updated.  Current Meds  Medication Sig   Albuterol Sulfate (PROAIR RESPICLICK) 108 (90 Base) MCG/ACT AEPB Inhale 2 puffs into the lungs 4 (four) times daily as needed.   clobetasol cream (TEMOVATE) 0.05 % Apply 1 application topically 2 (two) times daily.   cyclobenzaprine (FLEXERIL) 10 MG tablet Take 1 tablet (10 mg total) by mouth 3 (three) times daily as  needed for muscle spasms.   diclofenac (VOLTAREN) 50 MG EC tablet Take 50 mg by mouth 2 (two) times daily.   lisinopril (ZESTRIL) 10 MG tablet Take 1 tablet (10 mg total) by mouth daily.   tazarotene (AVAGE) 0.1 % cream Apply 1 application topically in the morning and at bedtime.    PHQ 2/9 Scores 12/19/2020 11/16/2020 09/24/2020 09/10/2020  PHQ - 2 Score 0 0 0 0  PHQ- 9 Score 2 3 0 1    GAD 7 : Generalized Anxiety Score 12/19/2020 11/16/2020 09/24/2020 09/10/2020  Nervous, Anxious, on Edge 0 1 0 0  Control/stop worrying 0 0 0 0  Worry too much - different things 0 0 0 0  Trouble relaxing 0 0 0 0  Restless 0 0 0 0  Easily annoyed or irritable 0 0 0 0  Afraid - awful might happen 0 0 0 0  Total GAD 7 Score 0 1 0 0  Anxiety  Difficulty Not difficult at all - Not difficult at all Not difficult at all    BP Readings from Last 3 Encounters:  12/19/20 (!) 148/98  11/16/20 (!) 150/105  09/24/20 (!) 146/88    Physical Exam Constitutional:      Appearance: Normal appearance.  Cardiovascular:     Rate and Rhythm: Normal rate and regular rhythm.     Pulses: Normal pulses.     Heart sounds: No murmur heard. Pulmonary:     Breath sounds: No wheezing or rhonchi.  Musculoskeletal:     Cervical back: Normal range of motion.     Right lower leg: No edema.     Left lower leg: No edema.  Lymphadenopathy:     Cervical: No cervical adenopathy.  Neurological:     General: No focal deficit present.     Mental Status: She is alert.    Wt Readings from Last 3 Encounters:  12/19/20 218 lb 6.4 oz (99.1 kg)  11/16/20 215 lb (97.5 kg)  09/24/20 219 lb (99.3 kg)    BP (!) 148/98   Pulse 100   Ht 5\' 5"  (1.651 m)   Wt 218 lb 6.4 oz (99.1 kg)   SpO2 98%   BMI 36.34 kg/m   Assessment and Plan: 1. Essential hypertension BP is not responding to lisinopril 10 mg and she has a slight dry cough. Begin Benicar HCT and follow up in 6 weeks and bring her home cuff - olmesartan-hydrochlorothiazide (BENICAR HCT) 40-25 MG tablet; Take 1 tablet by mouth daily.  Dispense: 30 tablet; Refill: 1  2. Arthralgia of left temporomandibular joint She has responded well to Flexeril which she can continue to use as needed   Partially dictated using . Any errors are unintentional.  Animal nutritionist, MD [facility]  12/19/2020

## 2021-02-04 ENCOUNTER — Ambulatory Visit: Payer: 59 | Admitting: Internal Medicine

## 2021-02-04 ENCOUNTER — Other Ambulatory Visit: Payer: Self-pay

## 2021-02-04 ENCOUNTER — Encounter: Payer: Self-pay | Admitting: Internal Medicine

## 2021-02-04 VITALS — BP 122/84 | HR 107 | Temp 97.7°F | Ht 65.0 in | Wt 218.0 lb

## 2021-02-04 DIAGNOSIS — M542 Cervicalgia: Secondary | ICD-10-CM | POA: Diagnosis not present

## 2021-02-04 DIAGNOSIS — Z1211 Encounter for screening for malignant neoplasm of colon: Secondary | ICD-10-CM | POA: Diagnosis not present

## 2021-02-04 DIAGNOSIS — I10 Essential (primary) hypertension: Secondary | ICD-10-CM

## 2021-02-04 DIAGNOSIS — F17200 Nicotine dependence, unspecified, uncomplicated: Secondary | ICD-10-CM

## 2021-02-04 DIAGNOSIS — G5621 Lesion of ulnar nerve, right upper limb: Secondary | ICD-10-CM

## 2021-02-04 DIAGNOSIS — Z1231 Encounter for screening mammogram for malignant neoplasm of breast: Secondary | ICD-10-CM

## 2021-02-04 MED ORDER — OLMESARTAN MEDOXOMIL-HCTZ 40-25 MG PO TABS: 1.0000 | ORAL_TABLET | Freq: Every day | ORAL | 1 refills | Status: DC

## 2021-02-04 MED ORDER — CYCLOBENZAPRINE HCL 10 MG PO TABS: 10.0000 mg | ORAL_TABLET | Freq: Every day | ORAL | 1 refills | Status: DC

## 2021-02-04 NOTE — Progress Notes (Signed)
Date:  02/04/2021   Name:  Natalie Osborne   DOB:  [birth year]   MRN:  [id number]   Chief Complaint: Hypertension  Hypertension This is a chronic problem. The problem has been gradually improving since onset. Pertinent negatives include no chest pain, headaches, palpitations, peripheral edema or shortness of breath. Past treatments include angiotensin blockers and diuretics (cough with lisinopril so meds changes last visit). The current treatment provides significant improvement. There are no compliance problems (had side effects for a few days but not normal).   Arm Pain  Incident onset: hx of nerve conduction studies in the past. Incident location: aggravated by resting on hard surface and during sleep. There was no injury mechanism. The pain is present in the right elbow and left elbow. The quality of the pain is described as burning and shooting. The pain radiates to the right hand. The pain is mild. The pain has been Fluctuating since the incident. Associated symptoms include numbness (in right hand radiating from her elbow) and tingling. Pertinent negatives include no chest pain or muscle weakness.   Lab Results  Component Value Date   NA 140 08/01/2020   K 4.9 08/01/2020   CO2 23 08/01/2020   GLUCOSE 88 08/01/2020   BUN 11 08/01/2020   CREATININE 0.73 08/01/2020   CALCIUM 10.1 08/01/2020   EGFR 97 08/01/2020   GFRNONAA 95 06/26/2017   Lab Results  Component Value Date   CHOL 247 (H) 08/01/2020   HDL 77 08/01/2020   LDLCALC 149 (H) 08/01/2020   TRIG 123 08/01/2020   CHOLHDL 3.2 08/01/2020   Lab Results  Component Value Date   TSH 1.490 08/01/2020   Lab Results  Component Value Date   HGBA1C 5.8 (H) 06/26/2017   Lab Results  Component Value Date   WBC 5.9 08/01/2020   HGB 14.2 08/01/2020   HCT 42.4 08/01/2020   MCV 92 08/01/2020   PLT 231 08/01/2020   Lab Results  Component Value Date   ALT 13 08/01/2020   AST 16 08/01/2020   ALKPHOS 85 08/01/2020   BILITOT 0.4  08/01/2020   Lab Results  Component Value Date   VD25OH 21.5 (L) 08/01/2020     Review of Systems  Constitutional:  Negative for fatigue and unexpected weight change.  HENT:  Negative for nosebleeds.   Eyes:  Negative for visual disturbance.  Respiratory:  Negative for cough, chest tightness, shortness of breath and wheezing.   Cardiovascular:  Negative for chest pain, palpitations and leg swelling.  Gastrointestinal:  Negative for abdominal pain, constipation and diarrhea.  Musculoskeletal:  Positive for arthralgias (right elbow - dislocated as a child).  Neurological:  Positive for tingling and numbness (in right hand radiating from her elbow). Negative for dizziness, weakness, light-headedness and headaches.  Psychiatric/Behavioral:  Negative for dysphoric mood and sleep disturbance. The patient is not nervous/anxious.    Patient Active Problem List   Diagnosis Date Noted   Essential hypertension 12/19/2020   Arthralgia of left temporomandibular joint 11/16/2020   Chest pain, atypical 11/16/2020   Cervical spondylosis with radiculopathy 09/24/2020   Cervicalgia 09/10/2020   Mixed hyperlipidemia 08/02/2020   Eczema of both hands 03/30/2017   Plantar fasciitis of right foot 03/30/2017   Tobacco dependency 03/30/2017   Menopausal and female climacteric states 03/30/2017   BMI 37.0-37.9, adult 03/30/2017    Allergies  Allergen Reactions   Ace Inhibitors Cough   Penicillins Rash and Other (See Comments)    Reaction as a  kid.     Past Surgical History:  Procedure Laterality Date   CATARACT EXTRACTION  2011   BILATERAL   KNEE SURGERY  2001   Right Knee   TUBAL LIGATION      Social History   Tobacco Use   Smoking status: Every Day    Packs/day: 1.00    Years: 42.00    Pack years: 42.00    Types: Cigarettes   Smokeless tobacco: Never  Vaping Use   Vaping Use: Never used  Substance Use Topics   Alcohol use: Yes    Alcohol/week: 1.0 standard drink    Types: 1  Shots of liquor per week   Drug use: Never     Medication list has been reviewed and updated.  Current Meds  Medication Sig   Albuterol Sulfate (PROAIR RESPICLICK) 353 (90 Base) MCG/ACT AEPB Inhale 2 puffs into the lungs 4 (four) times daily as needed.   clobetasol cream (TEMOVATE) 6.14 % Apply 1 application topically 2 (two) times daily.   cyclobenzaprine (FLEXERIL) 10 MG tablet Take 1 tablet (10 mg total) by mouth 3 (three) times daily as needed for muscle spasms.   diclofenac (VOLTAREN) 50 MG EC tablet Take 50 mg by mouth 2 (two) times daily.   olmesartan-hydrochlorothiazide (BENICAR HCT) 40-25 MG tablet Take 1 tablet by mouth daily.   tazarotene (AVAGE) 0.1 % cream Apply 1 application topically in the morning and at bedtime.    PHQ 2/9 Scores 02/04/2021 12/19/2020 11/16/2020 09/24/2020  PHQ - 2 Score 0 0 0 0  PHQ- 9 Score $Remov'2 2 3 'fmmyyT$ 0    GAD 7 : Generalized Anxiety Score 02/04/2021 12/19/2020 11/16/2020 09/24/2020  Nervous, Anxious, on Edge 0 0 1 0  Control/stop worrying 0 0 0 0  Worry too much - different things 0 0 0 0  Trouble relaxing 0 0 0 0  Restless 0 0 0 0  Easily annoyed or irritable 0 0 0 0  Afraid - awful might happen 0 0 0 0  Total GAD 7 Score 0 0 1 0  Anxiety Difficulty Not difficult at all Not difficult at all - Not difficult at all    BP Readings from Last 3 Encounters:  02/04/21 122/84  12/19/20 (!) 148/98  11/16/20 (!) 150/105    Physical Exam Vitals and nursing note reviewed.  Constitutional:      General: She is not in acute distress.    Appearance: Normal appearance. She is well-developed.  HENT:     Head: Normocephalic and atraumatic.  Cardiovascular:     Rate and Rhythm: Normal rate and regular rhythm.     Pulses: Normal pulses.  Pulmonary:     Effort: Pulmonary effort is normal. No respiratory distress.     Breath sounds: No wheezing or rhonchi.  Musculoskeletal:     Right elbow: Swelling (bursa over elbow - not inflammed or tender) present. No  deformity or effusion.     Left elbow: No swelling, deformity or effusion.     Cervical back: Normal range of motion.     Right lower leg: No edema.     Left lower leg: No edema.  Lymphadenopathy:     Cervical: No cervical adenopathy.  Skin:    General: Skin is warm and dry.     Findings: No rash.  Neurological:     Mental Status: She is alert and oriented to person, place, and time.     Motor: No weakness.  Psychiatric:  Mood and Affect: Mood normal.        Behavior: Behavior normal.    Wt Readings from Last 3 Encounters:  02/04/21 218 lb (98.9 kg)  12/19/20 218 lb 6.4 oz (99.1 kg)  11/16/20 215 lb (97.5 kg)    BP 122/84   Pulse (!) 107   Temp 97.7 F (36.5 C) (Oral)   Ht $R'5\' 5"'dJ$  (1.651 m)   Wt 218 lb (98.9 kg)   SpO2 99%   BMI 36.28 kg/m   Assessment and Plan: 1. Essential hypertension Clinically stable exam with well controlled BP since changing lisinopril to benicar hct. Tolerating medications without side effects at this time.  Initial lightheadedness has resolved. Pt to continue current regimen and low sodium diet; benefits of regular exercise as able discussed. - olmesartan-hydrochlorothiazide (BENICAR HCT) 40-25 MG tablet; Take 1 tablet by mouth daily.  Dispense: 90 tablet; Refill: 1  2. Ulnar tunnel syndrome, right Recommend an elbow sleeve for protection Avoid resting elbow on hard surfaces Consider Ortho evaluation if desired  3. Cervicalgia Continue flexeril PRN - cyclobenzaprine (FLEXERIL) 10 MG tablet; Take 1 tablet (10 mg total) by mouth at bedtime.  Dispense: 90 tablet; Refill: 1  4. Colon cancer screening Pt declined colonoscopy - would prefer FIT testing - Fecal occult blood, imunochemical  5. Tobacco dependency - Ambulatory Referral for Lung Cancer Scre  6. Encounter for screening mammogram for breast cancer Schedule at [facility] - MM 3D SCREEN BREAST BILATERAL   Partially dictated using Editor, commissioning. Any errors are  unintentional.  Halina Maidens, MD [facility]  02/04/2021

## 2021-02-04 NOTE — Patient Instructions (Signed)
Get an Elbow sleeve - to wear during the day.

## 2021-02-13 ENCOUNTER — Telehealth: Payer: Self-pay

## 2021-02-13 NOTE — Telephone Encounter (Signed)
Called pt as a reminder to complete and turn in FIT test. Left VM.  [facility] nurse may give results to patient if they return call to clinic, a CRM has been created.  KP

## 2021-02-13 NOTE — Telephone Encounter (Signed)
Called pt as a reminder to call and schedule mammogram. ° °[facility] nurse may give results to patient if they return call to clinic, a CRM has been created. ° °KP °

## 2021-04-26 ENCOUNTER — Other Ambulatory Visit: Payer: Self-pay | Admitting: *Deleted

## 2021-04-26 DIAGNOSIS — F17210 Nicotine dependence, cigarettes, uncomplicated: Secondary | ICD-10-CM

## 2021-04-26 DIAGNOSIS — Z87891 Personal history of nicotine dependence: Secondary | ICD-10-CM

## 2021-05-01 LAB — FECAL OCCULT BLOOD, IMMUNOCHEMICAL: Fecal Occult Bld: NEGATIVE

## 2021-06-05 ENCOUNTER — Encounter: Payer: Self-pay | Admitting: Internal Medicine

## 2021-06-05 ENCOUNTER — Telehealth: Payer: Self-pay

## 2021-06-05 NOTE — Telephone Encounter (Signed)
Insurance will not approve our providers for sdmv per [facility] Ronalee Belts when requesting PA. Patient notified that our providers are out of network and she would need to reach out to her PCP to see if there is another option for her to get LCS or chest CT in insurance ([facility]) network.  Patient acknowledged understanding and will talk with her provider for another option for scan.  Appts for sdmv and ldct have been cancelled.  [facility] is approved for imaging center, in the event patient can get scan ordered by another provider.  Patient acknowledged understanding.   ?

## 2021-06-07 ENCOUNTER — Other Ambulatory Visit: Payer: Self-pay | Admitting: Internal Medicine

## 2021-06-07 DIAGNOSIS — F17200 Nicotine dependence, unspecified, uncomplicated: Secondary | ICD-10-CM

## 2021-06-17 ENCOUNTER — Ambulatory Visit
Admission: RE | Admit: 2021-06-17 | Discharge: 2021-06-17 | Disposition: A | Discharge status: Home / Self Care | Payer: 59 | Source: Ambulatory Visit | Attending: Internal Medicine | Admitting: Internal Medicine

## 2021-06-17 DIAGNOSIS — Z1231 Encounter for screening mammogram for malignant neoplasm of breast: Secondary | ICD-10-CM | POA: Insufficient documentation

## 2021-06-19 ENCOUNTER — Other Ambulatory Visit: Payer: Self-pay | Admitting: Internal Medicine

## 2021-06-21 ENCOUNTER — Encounter: Payer: 59 | Admitting: Acute Care

## 2021-06-21 ENCOUNTER — Ambulatory Visit: Payer: 59

## 2021-07-10 ENCOUNTER — Telehealth: Payer: Self-pay

## 2021-07-10 NOTE — Telephone Encounter (Signed)
Called pt left VM to call and schedule lung cancer screening. Pt name was stated on VM. ? ?KP ?

## 2021-08-01 ENCOUNTER — Encounter: Payer: Self-pay | Admitting: Internal Medicine

## 2021-08-06 ENCOUNTER — Encounter: Payer: 59 | Admitting: Internal Medicine

## 2021-08-14 ENCOUNTER — Other Ambulatory Visit: Payer: Self-pay | Admitting: Internal Medicine

## 2021-08-14 DIAGNOSIS — M542 Cervicalgia: Secondary | ICD-10-CM

## 2021-08-14 NOTE — Telephone Encounter (Signed)
Requested medications are due for refill today.  yes  Requested medications are on the active medications list.  yes  Last refill. 02/04/2021 #90 1 refill  Future visit scheduled.   yes  Notes to clinic.  Medication refill is not delegated.    Requested Prescriptions  Pending Prescriptions Disp Refills   cyclobenzaprine (FLEXERIL) 10 MG tablet [Pharmacy Med Name: CYCLOBENZAPRINE HCL TABS 10MG ] 90 tablet 3    Sig: TAKE 1 TABLET AT BEDTIME     Not Delegated - Analgesics:  Muscle Relaxants Failed - 08/14/2021  3:25 PM      Failed - This refill cannot be delegated      Failed - Valid encounter within last 6 months    Recent Outpatient Visits           6 months ago Essential hypertension   [facility] 08/16/2021, MD   7 months ago Essential hypertension   [facility], MD   9 months ago Pain in lower jaw   [facility], MD   10 months ago Cervical spondylosis with radiculopathy   [facility], MD   11 months ago Cervicalgia   [facility] Jerrol Banana, MD       Future Appointments             In 1 week Jerrol Banana Judithann Graves, MD [facility]

## 2021-08-23 ENCOUNTER — Encounter: Payer: Self-pay | Admitting: Internal Medicine

## 2021-08-23 ENCOUNTER — Ambulatory Visit (INDEPENDENT_AMBULATORY_CARE_PROVIDER_SITE_OTHER): Payer: 59 | Admitting: Internal Medicine

## 2021-08-23 VITALS — BP 126/60 | HR 103 | Ht 65.0 in | Wt 222.0 lb

## 2021-08-23 DIAGNOSIS — Z1211 Encounter for screening for malignant neoplasm of colon: Secondary | ICD-10-CM

## 2021-08-23 DIAGNOSIS — Z0000 Encounter for general adult medical examination without abnormal findings: Secondary | ICD-10-CM

## 2021-08-23 DIAGNOSIS — I10 Essential (primary) hypertension: Secondary | ICD-10-CM

## 2021-08-23 DIAGNOSIS — R002 Palpitations: Secondary | ICD-10-CM | POA: Diagnosis not present

## 2021-08-23 DIAGNOSIS — F17200 Nicotine dependence, unspecified, uncomplicated: Secondary | ICD-10-CM

## 2021-08-23 DIAGNOSIS — E782 Mixed hyperlipidemia: Secondary | ICD-10-CM

## 2021-08-23 DIAGNOSIS — M542 Cervicalgia: Secondary | ICD-10-CM

## 2021-08-23 LAB — POCT URINALYSIS DIPSTICK
Bilirubin, UA: NEGATIVE
Blood, UA: NEGATIVE
Glucose, UA: NEGATIVE
Ketones, UA: NEGATIVE
Leukocytes, UA: NEGATIVE
Nitrite, UA: NEGATIVE
Protein, UA: NEGATIVE
Spec Grav, UA: 1.015 (ref 1.010–1.025)
Urobilinogen, UA: 0.2 E.U./dL
pH, UA: 6 (ref 5.0–8.0)

## 2021-08-23 MED ORDER — OLMESARTAN MEDOXOMIL-HCTZ 40-25 MG PO TABS: 1.0000 | ORAL_TABLET | Freq: Every day | ORAL | 1 refills | Status: DC

## 2021-08-23 MED ORDER — CYCLOBENZAPRINE HCL 10 MG PO TABS: 10.0000 mg | ORAL_TABLET | Freq: Every day | ORAL | 0 refills | Status: DC

## 2021-08-23 MED ORDER — NICOTINE 14 MG/24HR TD PT24: 14.0000 mg | MEDICATED_PATCH | Freq: Every day | TRANSDERMAL | 0 refills | Status: DC

## 2021-08-23 NOTE — Progress Notes (Signed)
Date:  08/23/2021   Name:  Natalie Osborne   DOB:  May 20, 1965   MRN:  [id number]   Chief Complaint: Annual Exam Natalie Osborne is a 56 y.o. female who presents today for her Complete Annual Exam. She feels fairly well. She reports exercising none. She reports she is sleeping fairly well. Breast complaints none.  Mammogram: 06/2021 DEXA: none Pap smear: 06/2017 neg/neg Colonoscopy: FIT neg 05/2021  Health Maintenance Due  Topic Date Due   COLONOSCOPY (Pts 45-39yrs Insurance coverage will need to be confirmed)  Never done    Immunization History  Administered Date(s) Administered   Influenza-Unspecified 11/29/2016, 11/19/2018, 12/02/2019, 12/20/2020   PFIZER Comirnaty(Gray Top)Covid-19 Tri-Sucrose Vaccine 10/07/2019, 10/27/2019, 03/30/2020   PFIZER(Purple Top)SARS-COV-2 Vaccination 03/30/2020    Hypertension This is a chronic problem. The problem is controlled. Associated symptoms include palpitations. Pertinent negatives include no chest pain, headaches or shortness of breath. Past treatments include angiotensin blockers and diuretics. The current treatment provides significant improvement.  Palpitations  This is a recurrent problem. The current episode started more than 1 year ago. The problem occurs intermittently. The problem has been unchanged. On average, each episode lasts 5 seconds. Nothing aggravates the symptoms. Associated symptoms include weakness. Pertinent negatives include no anxiety, chest pain, coughing, dizziness, fever, shortness of breath or vomiting. She has tried nothing for the symptoms.   Tobacco use - she is ready to attempt quitting.  She wants to try the patches.  She recently had a CT screening which was normal except for multiple calcified granulomas  Lab Results  Component Value Date   NA 140 08/01/2020   K 4.9 08/01/2020   CO2 23 08/01/2020   GLUCOSE 88 08/01/2020   BUN 11 08/01/2020   CREATININE 0.73 08/01/2020   CALCIUM 10.1 08/01/2020   EGFR 97  08/01/2020   GFRNONAA 95 06/26/2017   Lab Results  Component Value Date   CHOL 247 (H) 08/01/2020   HDL 77 08/01/2020   LDLCALC 149 (H) 08/01/2020   TRIG 123 08/01/2020   CHOLHDL 3.2 08/01/2020   Lab Results  Component Value Date   TSH 1.490 08/01/2020   Lab Results  Component Value Date   HGBA1C 5.8 (H) 06/26/2017   Lab Results  Component Value Date   WBC 5.9 08/01/2020   HGB 14.2 08/01/2020   HCT 42.4 08/01/2020   MCV 92 08/01/2020   PLT 231 08/01/2020   Lab Results  Component Value Date   ALT 13 08/01/2020   AST 16 08/01/2020   ALKPHOS 85 08/01/2020   BILITOT 0.4 08/01/2020   Lab Results  Component Value Date   VD25OH 21.5 (L) 08/01/2020     Review of Systems  Constitutional:  Negative for chills, fatigue and fever.  HENT:  Negative for congestion, hearing loss, tinnitus, trouble swallowing and voice change.   Eyes:  Negative for visual disturbance.  Respiratory:  Negative for cough, chest tightness, shortness of breath and wheezing.   Cardiovascular:  Positive for palpitations. Negative for chest pain and leg swelling.  Gastrointestinal:  Negative for abdominal pain, constipation, diarrhea and vomiting.  Endocrine: Negative for polydipsia and polyuria.  Genitourinary:  Negative for dysuria, frequency, genital sores, vaginal bleeding and vaginal discharge.  Musculoskeletal:  Negative for arthralgias, gait problem and joint swelling.  Skin:  Negative for color change and rash.  Neurological:  Positive for weakness and light-headedness (with palpitations). Negative for dizziness, tremors and headaches.  Hematological:  Negative for adenopathy. Does not bruise/bleed easily.  Psychiatric/Behavioral:  Negative for dysphoric mood and sleep disturbance. The patient is not nervous/anxious.     Patient Active Problem List   Diagnosis Date Noted   Essential hypertension 12/19/2020   Arthralgia of left temporomandibular joint 11/16/2020   Chest pain, atypical  11/16/2020   Cervical spondylosis with radiculopathy 09/24/2020   Cervicalgia 09/10/2020   Mixed hyperlipidemia 08/02/2020   Eczema of both hands 03/30/2017   Plantar fasciitis of right foot 03/30/2017   Tobacco dependency 03/30/2017   Menopausal and female climacteric states 03/30/2017   BMI 37.0-37.9, adult 03/30/2017    Allergies  Allergen Reactions   Ace Inhibitors Cough   Penicillins Rash and Other (See Comments)    Reaction as a kid.     Past Surgical History:  Procedure Laterality Date   CATARACT EXTRACTION  2011   BILATERAL   KNEE SURGERY  2001   Right Knee   TUBAL LIGATION      Social History   Tobacco Use   Smoking status: Every Day    Packs/day: 1.00    Years: 42.00    Total pack years: 42.00    Types: Cigarettes   Smokeless tobacco: Never  Vaping Use   Vaping Use: Never used  Substance Use Topics   Alcohol use: Yes    Alcohol/week: 1.0 standard drink of alcohol    Types: 1 Shots of liquor per week   Drug use: Never     Medication list has been reviewed and updated.  Current Meds  Medication Sig   Albuterol Sulfate (PROAIR RESPICLICK) 108 (90 Base) MCG/ACT AEPB Inhale 2 puffs into the lungs 4 (four) times daily as needed.   clobetasol cream (TEMOVATE) 0.05 % Apply 1 application topically 2 (two) times daily.   nicotine (NICODERM CQ) 14 mg/24hr patch Place 1 patch (14 mg total) onto the skin daily.   tazarotene (AVAGE) 0.1 % cream Apply 1 application topically in the morning and at bedtime.   [DISCONTINUED] cyclobenzaprine (FLEXERIL) 10 MG tablet TAKE 1 TABLET AT BEDTIME   [DISCONTINUED] olmesartan-hydrochlorothiazide (BENICAR HCT) 40-25 MG tablet Take 1 tablet by mouth daily.       08/23/2021   10:03 AM 02/04/2021   11:09 AM 12/19/2020    2:45 PM 11/16/2020   11:45 AM  GAD 7 : Generalized Anxiety Score  Nervous, Anxious, on Edge 0 0 0 1  Control/stop worrying 0 0 0 0  Worry too much - different things 0 0 0 0  Trouble relaxing 0 0 0 0   Restless 0 0 0 0  Easily annoyed or irritable 0 0 0 0  Afraid - awful might happen 0 0 0 0  Total GAD 7 Score 0 0 0 1  Anxiety Difficulty Not difficult at all Not difficult at all Not difficult at all        08/23/2021   10:03 AM  Depression screen PHQ 2/9  Decreased Interest 0  Down, Depressed, Hopeless 0  PHQ - 2 Score 0  Altered sleeping 0  Tired, decreased energy 1  Change in appetite 0  Feeling bad or failure about yourself  0  Trouble concentrating 0  Moving slowly or fidgety/restless 0  Suicidal thoughts 0  PHQ-9 Score 1  Difficult doing work/chores Not difficult at all    BP Readings from Last 3 Encounters:  08/23/21 126/60  02/04/21 122/84  12/19/20 (!) 148/98    Physical Exam Vitals and nursing note reviewed.  Constitutional:      General: She is  not in acute distress.    Appearance: She is well-developed.  HENT:     Head: Normocephalic and atraumatic.     Right Ear: Tympanic membrane and ear canal normal.     Left Ear: Tympanic membrane and ear canal normal.     Nose:     Right Sinus: No maxillary sinus tenderness.     Left Sinus: No maxillary sinus tenderness.  Eyes:     General: No scleral icterus.       Right eye: No discharge.        Left eye: No discharge.     Conjunctiva/sclera: Conjunctivae normal.  Neck:     Thyroid: No thyromegaly.     Vascular: No carotid bruit.  Cardiovascular:     Rate and Rhythm: Normal rate and regular rhythm.     Pulses: Normal pulses.     Heart sounds: Normal heart sounds.  Pulmonary:     Effort: Pulmonary effort is normal. No respiratory distress.     Breath sounds: No wheezing.  Chest:  Breasts:    Right: No mass, nipple discharge, skin change or tenderness.     Left: No mass, nipple discharge, skin change or tenderness.  Abdominal:     General: Bowel sounds are normal.     Palpations: Abdomen is soft.     Tenderness: There is no abdominal tenderness.  Musculoskeletal:        General: Normal range of  motion.     Cervical back: Normal range of motion. No erythema.     Right lower leg: No edema.     Left lower leg: No edema.  Lymphadenopathy:     Cervical: No cervical adenopathy.  Skin:    General: Skin is warm and dry.     Capillary Refill: Capillary refill takes less than 2 seconds.     Findings: No rash.  Neurological:     General: No focal deficit present.     Mental Status: She is alert and oriented to person, place, and time.     Cranial Nerves: No cranial nerve deficit.     Sensory: No sensory deficit.     Deep Tendon Reflexes: Reflexes are normal and symmetric.  Psychiatric:        Attention and Perception: Attention normal.        Mood and Affect: Mood normal.     Wt Readings from Last 3 Encounters:  08/23/21 222 lb (100.7 kg)  02/04/21 218 lb (98.9 kg)  12/19/20 218 lb 6.4 oz (99.1 kg)    BP 126/60   Pulse (!) 103   Ht 5\' 5"  (1.651 m)   Wt 222 lb (100.7 kg)   SpO2 97%   BMI 36.94 kg/m   Assessment and Plan: 1. Annual physical exam Exam is normal except for weight. Encourage regular exercise and appropriate dietary changes. Declines immunizations today - going on vacation - Hemoglobin A1c  2. Colon cancer screening FIT test done in March Will recommend Cologuard next year  3. Essential hypertension Clinically stable exam with well controlled BP. Tolerating medications without side effects at this time. Pt to continue current regimen and low sodium diet; benefits of regular exercise as able discussed. - CBC with Differential/Platelet - Comprehensive metabolic panel - TSH - POCT urinalysis dipstick - olmesartan-hydrochlorothiazide (BENICAR HCT) 40-25 MG tablet; Take 1 tablet by mouth daily.  Dispense: 90 tablet; Refill: 1  4. Mixed hyperlipidemia - Lipid panel  5. Tobacco dependency She is ready to work on quitting  and in interested in the patches. She is also doing the LDCT screening at [facility] - normal except for scattered small calcified  granulomas - nicotine (NICODERM CQ) 14 mg/24hr patch; Place 1 patch (14 mg total) onto the skin daily.  Dispense: 84 patch; Refill: 0  6. Cervicalgia Continue Flexeril as needed - cyclobenzaprine (FLEXERIL) 10 MG tablet; Take 1 tablet (10 mg total) by mouth at bedtime.  Dispense: 90 tablet; Refill: 0  7. Intermittent palpitations Recommend that she return for placement of a Zio Patch Monitor in several weeks.   Partially dictated using Animal nutritionist. Any errors are unintentional.  Bari Edward, MD [facility]  08/23/2021

## 2021-08-24 LAB — COMPREHENSIVE METABOLIC PANEL
ALT: 23 IU/L (ref 0–32)
AST: 16 IU/L (ref 0–40)
Albumin/Globulin Ratio: 1.7 (ref 1.2–2.2)
Albumin: 4.3 g/dL (ref 3.8–4.9)
Alkaline Phosphatase: 75 IU/L (ref 44–121)
BUN/Creatinine Ratio: 19 (ref 9–23)
BUN: 17 mg/dL (ref 6–24)
Bilirubin Total: 0.3 mg/dL (ref 0.0–1.2)
CO2: 24 mmol/L (ref 20–29)
Calcium: 9.9 mg/dL (ref 8.7–10.2)
Chloride: 103 mmol/L (ref 96–106)
Creatinine, Ser: 0.9 mg/dL (ref 0.57–1.00)
Globulin, Total: 2.6 g/dL (ref 1.5–4.5)
Glucose: 90 mg/dL (ref 70–99)
Potassium: 4.5 mmol/L (ref 3.5–5.2)
Sodium: 140 mmol/L (ref 134–144)
Total Protein: 6.9 g/dL (ref 6.0–8.5)
eGFR: 75 mL/min/{1.73_m2} (ref >59)

## 2021-08-24 LAB — HEMOGLOBIN A1C
Est. average glucose Bld gHb Est-mCnc: 120 mg/dL
Hgb A1c MFr Bld: 5.8 % — ABNORMAL HIGH (ref 4.8–5.6)

## 2021-08-24 LAB — CBC WITH DIFFERENTIAL/PLATELET
Basophils Absolute: 0.1 10*3/uL (ref 0.0–0.2)
Basos: 1 %
EOS (ABSOLUTE): 0.2 10*3/uL (ref 0.0–0.4)
Eos: 3 %
Hematocrit: 40.4 % (ref 34.0–46.6)
Hemoglobin: 13.5 g/dL (ref 11.1–15.9)
Immature Grans (Abs): 0 10*3/uL (ref 0.0–0.1)
Immature Granulocytes: 0 %
Lymphocytes Absolute: 2 10*3/uL (ref 0.7–3.1)
Lymphs: 32 %
MCH: 30.5 pg (ref 26.6–33.0)
MCHC: 33.4 g/dL (ref 31.5–35.7)
MCV: 91 fL (ref 79–97)
Monocytes Absolute: 0.4 10*3/uL (ref 0.1–0.9)
Monocytes: 7 %
Neutrophils Absolute: 3.7 10*3/uL (ref 1.4–7.0)
Neutrophils: 57 %
Platelets: 292 10*3/uL (ref 150–450)
RBC: 4.42 x10E6/uL (ref 3.77–5.28)
RDW: 12.9 % (ref 11.7–15.4)
WBC: 6.4 10*3/uL (ref 3.4–10.8)

## 2021-08-24 LAB — LIPID PANEL
Chol/HDL Ratio: 4.1 ratio (ref 0.0–4.4)
Cholesterol, Total: 243 mg/dL — ABNORMAL HIGH (ref 100–199)
HDL: 60 mg/dL (ref >39)
LDL Chol Calc (NIH): 151 mg/dL — ABNORMAL HIGH (ref 0–99)
Triglycerides: 178 mg/dL — ABNORMAL HIGH (ref 0–149)
VLDL Cholesterol Cal: 32 mg/dL (ref 5–40)

## 2021-08-24 LAB — TSH: TSH: 2.1 u[IU]/mL (ref 0.450–4.500)

## 2022-03-18 ENCOUNTER — Other Ambulatory Visit: Payer: Self-pay | Admitting: Internal Medicine

## 2022-03-18 DIAGNOSIS — M542 Cervicalgia: Secondary | ICD-10-CM

## 2022-03-19 NOTE — Telephone Encounter (Signed)
Requested medication (s) are due for refill today: yes  Requested medication (s) are on the active medication list: yes  Last refill:  08/23/21  Future visit scheduled: yes  Notes to clinic:  Unable to refill per protocol, cannot delegate.      Requested Prescriptions  Pending Prescriptions Disp Refills   cyclobenzaprine (FLEXERIL) 10 MG tablet [Pharmacy Med Name: CYCLOBENZAPRINE HCL TABS 10MG ] 90 tablet 3    Sig: TAKE 1 TABLET AT BEDTIME     Not Delegated - Analgesics:  Muscle Relaxants Failed - 03/18/2022  5:17 PM      Failed - This refill cannot be delegated      Failed - Valid encounter within last 6 months    Recent Outpatient Visits           6 months ago Annual physical exam   [place] [facility], Jesse Sans, MD   1 year ago Essential hypertension   [place] [facility], Jesse Sans, MD   1 year ago Essential hypertension   [place] [facility], Jesse Sans, MD   1 year ago Pain in lower jaw   [place] [facility], Jesse Sans, MD   1 year ago Cervical spondylosis with radiculopathy   [facility] at [facility], Earley Abide, MD

## 2022-04-11 ENCOUNTER — Ambulatory Visit: Payer: 59 | Admitting: Internal Medicine

## 2022-04-11 ENCOUNTER — Encounter: Payer: Self-pay | Admitting: Internal Medicine

## 2022-04-11 ENCOUNTER — Ambulatory Visit: Payer: 59 | Attending: Internal Medicine

## 2022-04-11 VITALS — BP 126/74 | HR 104 | Ht 65.0 in | Wt 212.0 lb

## 2022-04-11 DIAGNOSIS — F17200 Nicotine dependence, unspecified, uncomplicated: Secondary | ICD-10-CM | POA: Diagnosis not present

## 2022-04-11 DIAGNOSIS — R000 Tachycardia, unspecified: Secondary | ICD-10-CM | POA: Insufficient documentation

## 2022-04-11 DIAGNOSIS — I10 Essential (primary) hypertension: Secondary | ICD-10-CM | POA: Diagnosis not present

## 2022-04-11 MED ORDER — PROAIR RESPICLICK 108 (90 BASE) MCG/ACT IN AEPB: 2.0000 | INHALATION_SPRAY | Freq: Four times a day (QID) | RESPIRATORY_TRACT | 5 refills | Status: DC | PRN

## 2022-04-11 NOTE — Assessment & Plan Note (Signed)
Clinically stable exam with well controlled BP on olmesartan hct. Tolerating medications without side effects. Pt to continue current regimen and low sodium diet.

## 2022-04-11 NOTE — Assessment & Plan Note (Addendum)
Recurrent episodes of rapid heart beat Usually when active at work Generally well tolerated but a few days ago felt very lightheaded Rec Zio monitor and cardiology evaluation if needed

## 2022-04-11 NOTE — Progress Notes (Signed)
Date:  04/11/2022   Name:  Natalie Osborne   DOB:  Jul 09, 1965   MRN:  [id number]   Chief Complaint: Palpitations (Wants zio patch, had a episode at work on Tuesday with palpitations)  Palpitations  This is a recurrent problem. The problem occurs intermittently. Progression since onset: more over the past few days. The symptoms are aggravated by unknown. Associated symptoms include syncope. Pertinent negatives include no anxiety, chest pain, coughing, dizziness, fever or shortness of breath. Associated symptoms comments: Rapid heartbeat.  Hypertension This is a chronic problem. The problem is controlled. Associated symptoms include palpitations. Pertinent negatives include no chest pain, headaches or shortness of breath. Past treatments include angiotensin blockers and diuretics. The current treatment provides significant improvement.    Lab Results  Component Value Date   NA 140 08/23/2021   K 4.5 08/23/2021   CO2 24 08/23/2021   GLUCOSE 90 08/23/2021   BUN 17 08/23/2021   CREATININE 0.90 08/23/2021   CALCIUM 9.9 08/23/2021   EGFR 75 08/23/2021   GFRNONAA 95 06/26/2017   Lab Results  Component Value Date   CHOL 243 (H) 08/23/2021   HDL 60 08/23/2021   LDLCALC 151 (H) 08/23/2021   TRIG 178 (H) 08/23/2021   CHOLHDL 4.1 08/23/2021   Lab Results  Component Value Date   TSH 2.100 08/23/2021   Lab Results  Component Value Date   HGBA1C 5.8 (H) 08/23/2021   Lab Results  Component Value Date   WBC 6.4 08/23/2021   HGB 13.5 08/23/2021   HCT 40.4 08/23/2021   MCV 91 08/23/2021   PLT 292 08/23/2021   Lab Results  Component Value Date   ALT 23 08/23/2021   AST 16 08/23/2021   ALKPHOS 75 08/23/2021   BILITOT 0.3 08/23/2021   Lab Results  Component Value Date   VD25OH 21.5 (L) 08/01/2020     Review of Systems  Constitutional:  Negative for chills, fatigue and fever.  Respiratory:  Negative for cough, chest tightness and shortness of breath.   Cardiovascular:  Positive  for palpitations and syncope. Negative for chest pain.  Neurological:  Positive for light-headedness. Negative for dizziness, syncope and headaches.  Psychiatric/Behavioral:  Negative for dysphoric mood and sleep disturbance. The patient is not nervous/anxious.     Patient Active Problem List   Diagnosis Date Noted   Tachycardia 04/11/2022   Essential hypertension 12/19/2020   Arthralgia of left temporomandibular joint 11/16/2020   Chest pain, atypical 11/16/2020   Cervical spondylosis with radiculopathy 09/24/2020   Cervicalgia 09/10/2020   Mixed hyperlipidemia 08/02/2020   Eczema of both hands 03/30/2017   Plantar fasciitis of right foot 03/30/2017   Tobacco dependency 03/30/2017   Menopausal and female climacteric states 03/30/2017   BMI 37.0-37.9, adult 03/30/2017    Allergies  Allergen Reactions   Ace Inhibitors Cough   Penicillins Rash and Other (See Comments)    Reaction as a kid.     Past Surgical History:  Procedure Laterality Date   CATARACT EXTRACTION  2011   BILATERAL   KNEE SURGERY  2001   Right Knee   TUBAL LIGATION      Social History   Tobacco Use   Smoking status: Every Day    Packs/day: 1.00    Years: 42.00    Total pack years: 42.00    Types: Cigarettes   Smokeless tobacco: Never  Vaping Use   Vaping Use: Never used  Substance Use Topics   Alcohol use: Yes  Alcohol/week: 1.0 standard drink of alcohol    Types: 1 Shots of liquor per week   Drug use: Never     Medication list has been reviewed and updated.  Current Meds  Medication Sig   clobetasol cream (TEMOVATE) [id number] % Apply 1 application topically 2 (two) times daily.   cyclobenzaprine (FLEXERIL) 10 MG tablet TAKE 1 TABLET AT BEDTIME   diclofenac (VOLTAREN) 50 MG EC tablet Take 50 mg by mouth 2 (two) times daily.   nicotine (NICODERM CQ) 14 mg/24hr patch Place 1 patch (14 mg total) onto the skin daily.   olmesartan-hydrochlorothiazide (BENICAR HCT) 40-25 MG tablet Take 1 tablet by  mouth daily.   tazarotene (AVAGE) 0.1 % cream Apply 1 application topically in the morning and at bedtime.   traMADol (ULTRAM) 50 MG tablet Take 50 mg by mouth every 4 (four) hours as needed.   [DISCONTINUED] Albuterol Sulfate (PROAIR RESPICLICK) 123XX123 (90 Base) MCG/ACT AEPB Inhale 2 puffs into the lungs 4 (four) times daily as needed.       04/11/2022    1:52 PM 08/23/2021   10:03 AM 02/04/2021   11:09 AM 12/19/2020    2:45 PM  GAD 7 : Generalized Anxiety Score  Nervous, Anxious, on Edge 0 0 0 0  Control/stop worrying 0 0 0 0  Worry too much - different things 0 0 0 0  Trouble relaxing 0 0 0 0  Restless 0 0 0 0  Easily annoyed or irritable 0 0 0 0  Afraid - awful might happen 0 0 0 0  Total GAD 7 Score 0 0 0 0  Anxiety Difficulty Not difficult at all Not difficult at all Not difficult at all Not difficult at all       04/11/2022    1:52 PM 08/23/2021   10:03 AM 02/04/2021   11:09 AM  Depression screen PHQ 2/9  Decreased Interest 2 0 0  Down, Depressed, Hopeless 0 0 0  PHQ - 2 Score 2 0 0  Altered sleeping 3 0 1  Tired, decreased energy 3 1 1  $ Change in appetite 0 0 0  Feeling bad or failure about yourself  0 0 0  Trouble concentrating 0 0 0  Moving slowly or fidgety/restless 0 0 0  Suicidal thoughts 0 0 0  PHQ-9 Score 8 1 2  $ Difficult doing work/chores Not difficult at all Not difficult at all Not difficult at all    BP Readings from Last 3 Encounters:  04/11/22 126/74  08/23/21 126/60  02/04/21 122/84    Physical Exam Vitals and nursing note reviewed.  Constitutional:      General: She is not in acute distress.    Appearance: Normal appearance. She is well-developed.  HENT:     Head: Normocephalic and atraumatic.  Neck:     Vascular: No carotid bruit.  Cardiovascular:     Rate and Rhythm: Normal rate and regular rhythm.     Pulses: Normal pulses.     Heart sounds: No murmur heard. Pulmonary:     Effort: Pulmonary effort is normal. No respiratory distress.      Breath sounds: No wheezing or rhonchi.  Musculoskeletal:     Cervical back: Normal range of motion.     Right lower leg: No edema.     Left lower leg: No edema.  Lymphadenopathy:     Cervical: No cervical adenopathy.  Skin:    General: Skin is warm and dry.     Findings: No rash.  Neurological:     Mental Status: She is alert and oriented to person, place, and time.  Psychiatric:        Mood and Affect: Mood normal.        Behavior: Behavior normal.     Wt Readings from Last 3 Encounters:  04/11/22 212 lb (96.2 kg)  08/23/21 222 lb (100.7 kg)  02/04/21 218 lb (98.9 kg)    BP 126/74   Pulse (!) 104   Ht 5' 5"$  (1.651 m)   Wt 212 lb (96.2 kg)   SpO2 99%   BMI 35.28 kg/m   Assessment and Plan: Problem List Items Addressed This Visit       Cardiovascular and Mediastinum   Essential hypertension (Chronic)    Clinically stable exam with well controlled BP on olmesartan hct. Tolerating medications without side effects. Pt to continue current regimen and low sodium diet.         Other   Tachycardia - Primary    Recurrent episodes of rapid heart beat Usually when active at work Generally well tolerated but a few days ago felt very lightheaded Rec Zio monitor and cardiology evaluation if needed      Relevant Orders   LONG TERM MONITOR (3-14 DAYS)   Tobacco dependency (Chronic)   Relevant Medications   Albuterol Sulfate (PROAIR RESPICLICK) 123XX123 (90 Base) MCG/ACT AEPB     Partially dictated using Editor, commissioning. Any errors are unintentional.  Halina Maidens, MD [facility]  04/11/2022

## 2022-05-14 ENCOUNTER — Other Ambulatory Visit: Payer: Self-pay

## 2022-05-14 DIAGNOSIS — I459 Conduction disorder, unspecified: Secondary | ICD-10-CM

## 2022-06-02 ENCOUNTER — Encounter: Payer: Self-pay | Admitting: Internal Medicine

## 2022-06-02 ENCOUNTER — Other Ambulatory Visit: Payer: Self-pay

## 2022-06-02 MED ORDER — ALBUTEROL SULFATE HFA 108 (90 BASE) MCG/ACT IN AERS: 2.0000 | INHALATION_SPRAY | Freq: Four times a day (QID) | RESPIRATORY_TRACT | 0 refills | Status: DC | PRN

## 2022-07-23 ENCOUNTER — Other Ambulatory Visit: Payer: Self-pay | Admitting: Internal Medicine

## 2022-07-23 DIAGNOSIS — I10 Essential (primary) hypertension: Secondary | ICD-10-CM

## 2022-07-23 DIAGNOSIS — M542 Cervicalgia: Secondary | ICD-10-CM

## 2022-07-24 NOTE — Telephone Encounter (Signed)
Requested Prescriptions  Pending Prescriptions Disp Refills   cyclobenzaprine (FLEXERIL) 10 MG tablet [Pharmacy Med Name: CYCLOBENZAPRINE HCL TABS 10MG ] 90 tablet 3    Sig: TAKE 1 TABLET AT BEDTIME     Not Delegated - Analgesics:  Muscle Relaxants Failed - 07/23/2022  6:56 PM      Failed - This refill cannot be delegated      Failed - Valid encounter within last 6 months    Recent Outpatient Visits           3 months ago Tachycardia   [place] [facility] at [facility], Nyoka Cowden, MD   11 months ago Annual physical exam   [facility] at [facility], Nyoka Cowden, MD   1 year ago Essential hypertension   [place] [facility] at [facility], Nyoka Cowden, MD   1 year ago Essential hypertension   [place] [facility] at [facility], Nyoka Cowden, MD   1 year ago Pain in lower jaw   [place] [facility] at [facility], Nyoka Cowden, MD               olmesartan-hydrochlorothiazide (BENICAR HCT) 40-25 MG tablet [Pharmacy Med Name: OLMESARTAN/HCTZ TABS 40/25MG ] 90 tablet 1    Sig: TAKE 1 TABLET DAILY     Cardiovascular: ARB + Diuretic Combos Failed - 07/23/2022  6:56 PM      Failed - K in normal range and within 180 days    Potassium  Date Value Ref Range Status  08/23/2021 4.5 3.5 - 5.2 mmol/L Final         Failed - Na in normal range and within 180 days    Sodium  Date Value Ref Range Status  08/23/2021 140 134 - 144 mmol/L Final         Failed - Cr in normal range and within 180 days    Creatinine, Ser  Date Value Ref Range Status  08/23/2021 0.90 0.57 - 1.00 mg/dL Final         Failed - eGFR is 10 or above and within 180 days    GFR calc Af Amer  Date Value Ref Range Status  06/26/2017 110 >59 mL/min/1.73 Final   GFR calc non Af Amer  Date Value Ref Range Status  06/26/2017 95 >59 mL/min/1.73  Final   eGFR  Date Value Ref Range Status  08/23/2021 75 >59 mL/min/1.73 Final         Failed - Valid encounter within last 6 months    Recent Outpatient Visits           3 months ago Tachycardia   [facility] at [facility], Nyoka Cowden, MD   11 months ago Annual physical exam   [facility] at [facility], Nyoka Cowden, MD   1 year ago Essential hypertension   [place] [facility] at [facility], Nyoka Cowden, MD   1 year ago Essential hypertension   [place] [facility] at [facility], Nyoka Cowden, MD   1 year ago Pain in lower jaw   [facility] at [facility], Nyoka Cowden, MD              Passed - Patient  is not pregnant      Passed - Last BP in normal range    BP Readings from Last 1 Encounters:  04/11/22 126/74

## 2022-07-24 NOTE — Telephone Encounter (Signed)
Requested medication (s) are due for refill today: Yes  Requested medication (s) are on the active medication list: Yes  Last refill:  03/19/22  Future visit scheduled: No  Notes to clinic:  See request.    Requested Prescriptions  Pending Prescriptions Disp Refills   cyclobenzaprine (FLEXERIL) 10 MG tablet [Pharmacy Med Name: CYCLOBENZAPRINE HCL TABS 10MG ] 90 tablet 3    Sig: TAKE 1 TABLET AT BEDTIME     Not Delegated - Analgesics:  Muscle Relaxants Failed - 07/23/2022  6:56 PM      Failed - This refill cannot be delegated      Failed - Valid encounter within last 6 months    Recent Outpatient Visits           3 months ago Tachycardia   [place] [facility] at [facility], Nyoka Cowden, MD   11 months ago Annual physical exam   [facility] at [facility], Nyoka Cowden, MD   1 year ago Essential hypertension   [place] [facility] at [facility], Nyoka Cowden, MD   1 year ago Essential hypertension   [place] [facility] at [facility], Nyoka Cowden, MD   1 year ago Pain in lower jaw   [place] [facility] at [facility], Nyoka Cowden, MD              Signed Prescriptions Disp Refills   olmesartan-hydrochlorothiazide (BENICAR HCT) 40-25 MG tablet 90 tablet 1    Sig: TAKE 1 TABLET DAILY     Cardiovascular: ARB + Diuretic Combos Failed - 07/23/2022  6:56 PM      Failed - K in normal range and within 180 days    Potassium  Date Value Ref Range Status  08/23/2021 4.5 3.5 - 5.2 mmol/L Final         Failed - Na in normal range and within 180 days    Sodium  Date Value Ref Range Status  08/23/2021 140 134 - 144 mmol/L Final         Failed - Cr in normal range and within 180 days    Creatinine, Ser  Date Value Ref Range Status  08/23/2021 0.90 0.57 - 1.00 mg/dL Final         Failed - eGFR is 10 or  above and within 180 days    GFR calc Af Amer  Date Value Ref Range Status  06/26/2017 110 >59 mL/min/1.73 Final   GFR calc non Af Amer  Date Value Ref Range Status  06/26/2017 95 >59 mL/min/1.73 Final   eGFR  Date Value Ref Range Status  08/23/2021 75 >59 mL/min/1.73 Final         Failed - Valid encounter within last 6 months    Recent Outpatient Visits           3 months ago Tachycardia   [facility] at [facility], Nyoka Cowden, MD   11 months ago Annual physical exam   [facility] at [facility], Nyoka Cowden, MD   1 year ago Essential hypertension    Primary Care & Sports Medicine at [facility], Nyoka Cowden, MD   1 year ago Essential hypertension   [facility] Medicine at [facility], Nyoka Cowden, MD  1 year ago Pain in lower jaw   [place] [facility] at [facility], Nyoka Cowden, MD              Passed - Patient is not pregnant      Passed - Last BP in normal range    BP Readings from Last 1 Encounters:  04/11/22 126/74

## 2022-08-11 IMAGING — MG MM DIGITAL SCREENING BILAT W/ TOMO AND CAD
8 series · 8 of 24 positions shown · non-contrast
Comparison: Previous exam(s).

CLINICAL DATA: Screening.

EXAM:
DIGITAL SCREENING BILATERAL MAMMOGRAM WITH TOMOSYNTHESIS AND CAD
TECHNIQUE: Bilateral screening digital craniocaudal and mediolateral oblique
mammograms were obtained. Bilateral screening digital breast
tomosynthesis was performed. The images were evaluated with
computer-aided detection.

[L CC synth-2D]
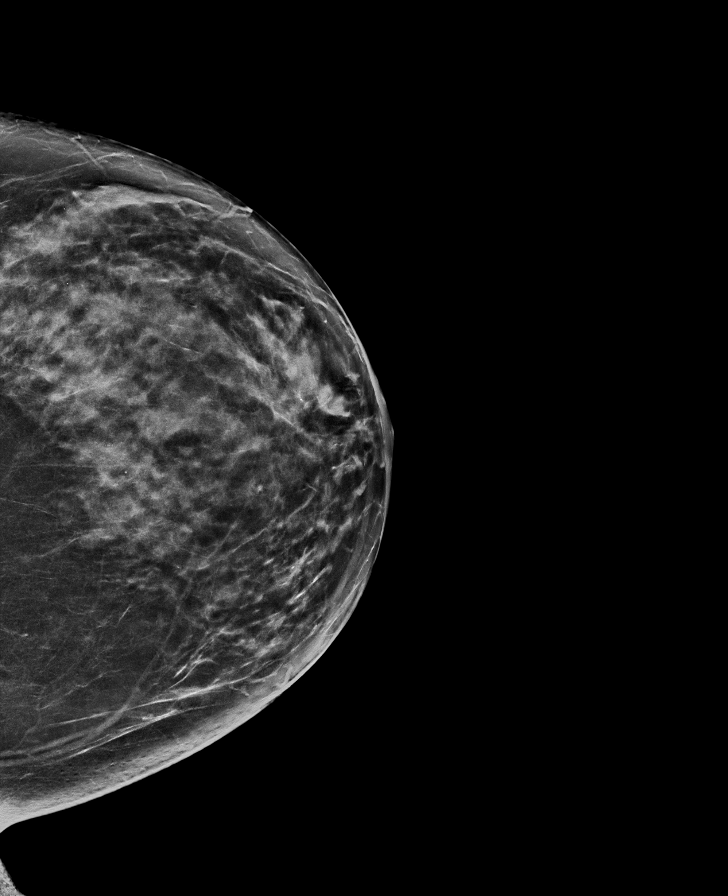

[R MLO synth-2D]
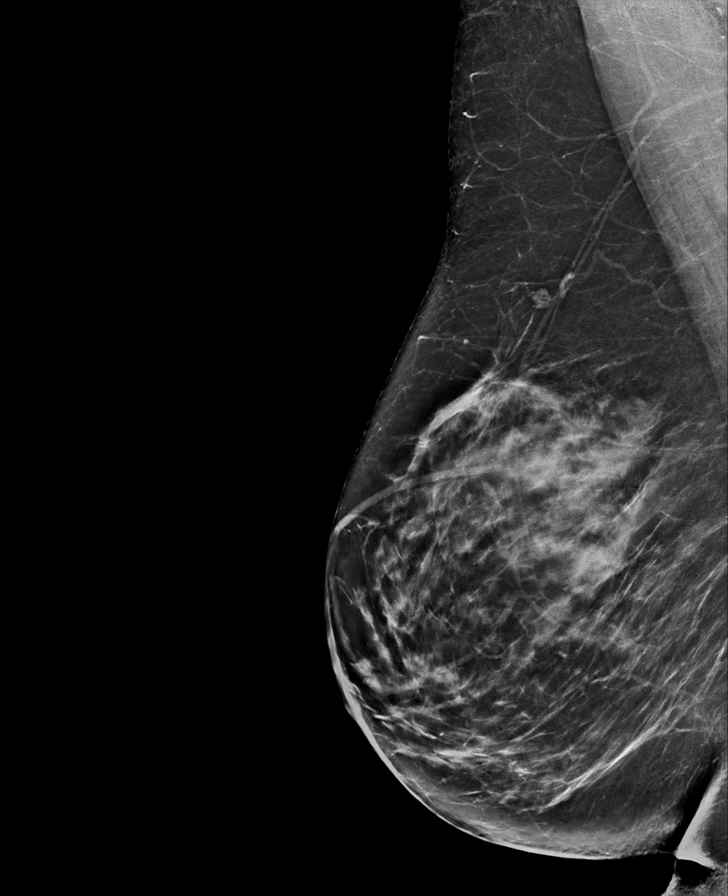

[L MLO synth-2D]
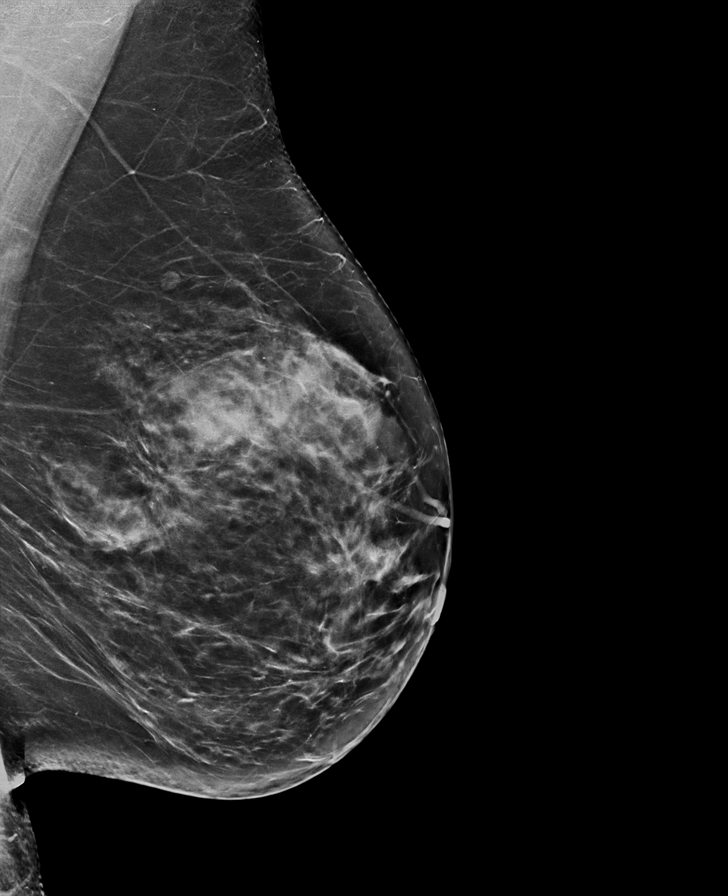

[R CC synth-2D]
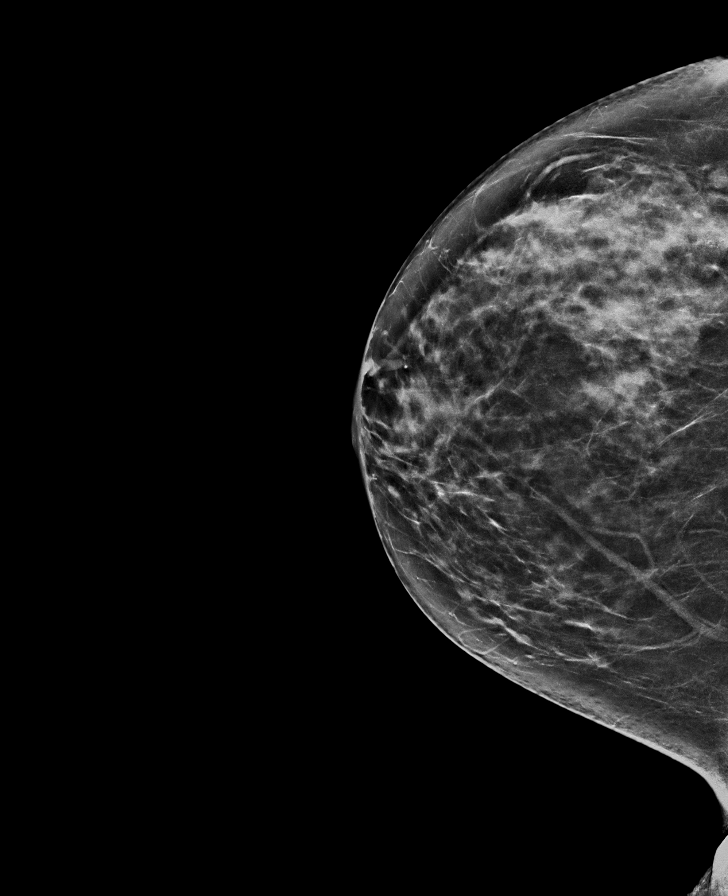

[R MLO tomo · tomo slice 41/81.0]
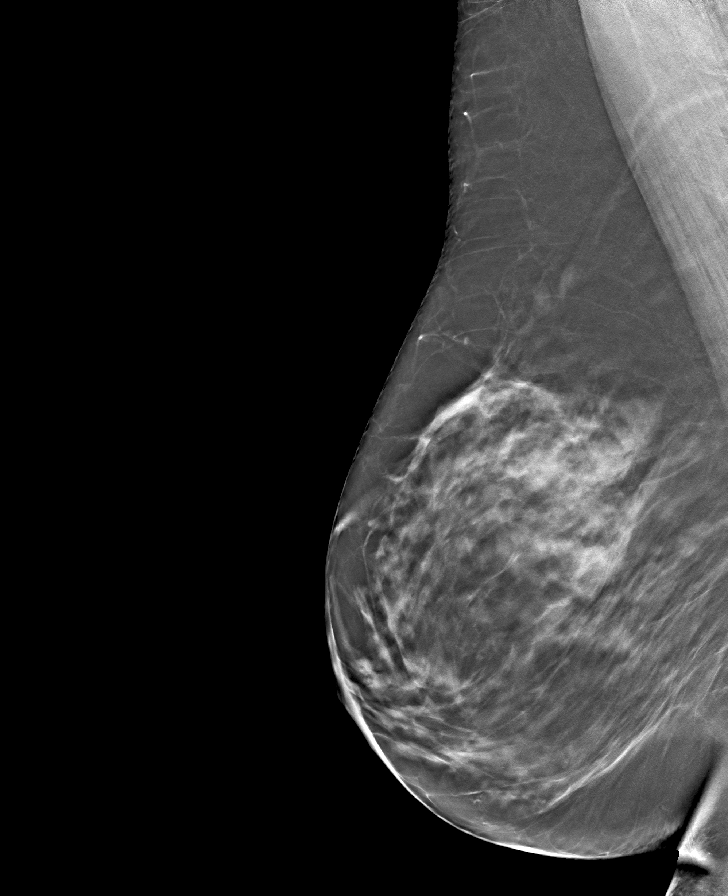

[R CC tomo · tomo slice 36/71.0]
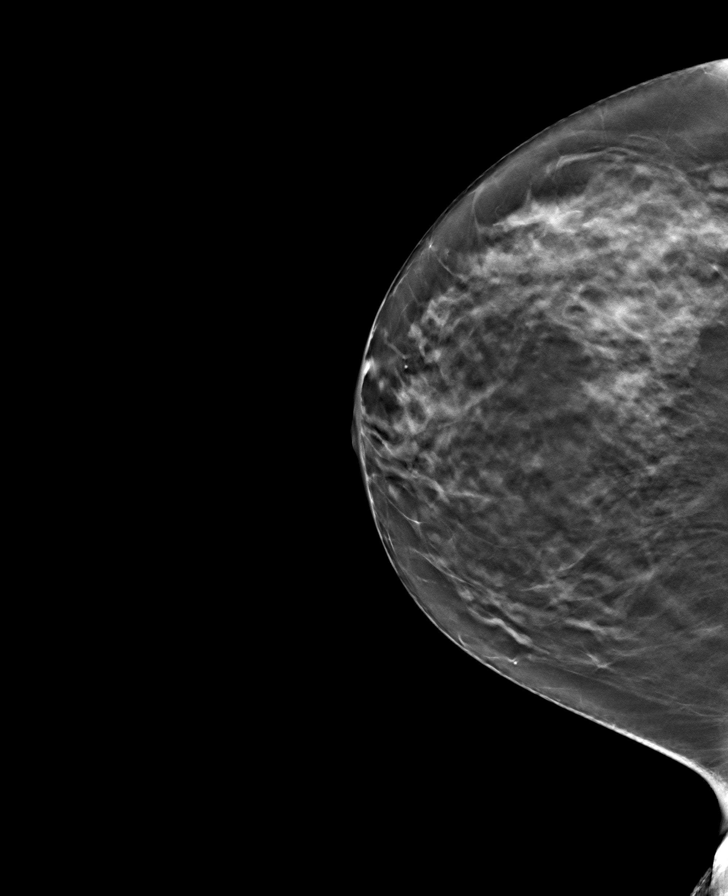

[L CC tomo · tomo slice 39/76.0]
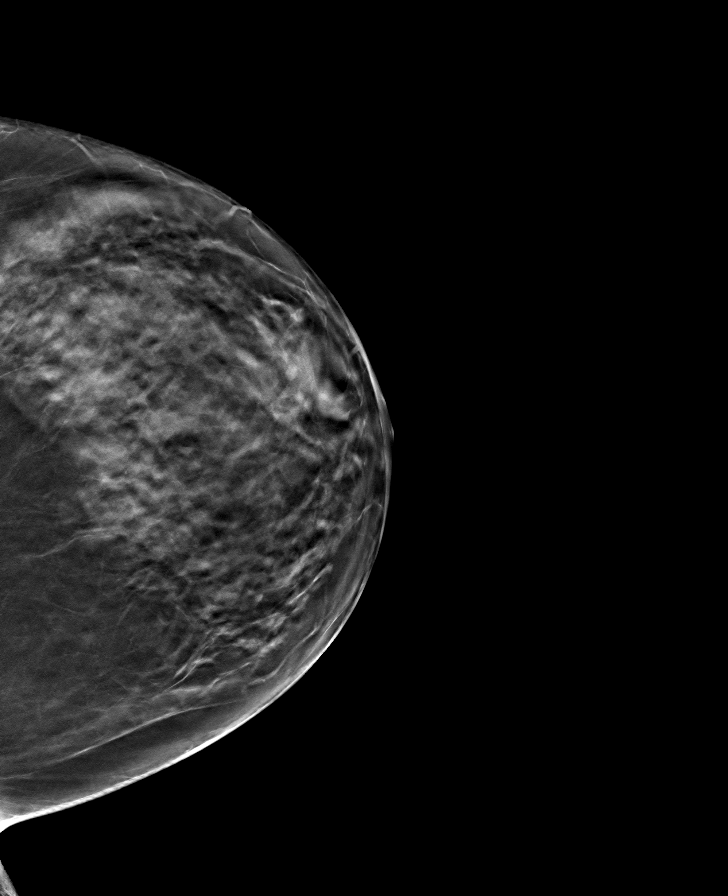

[L MLO tomo · tomo slice 41/80.0]
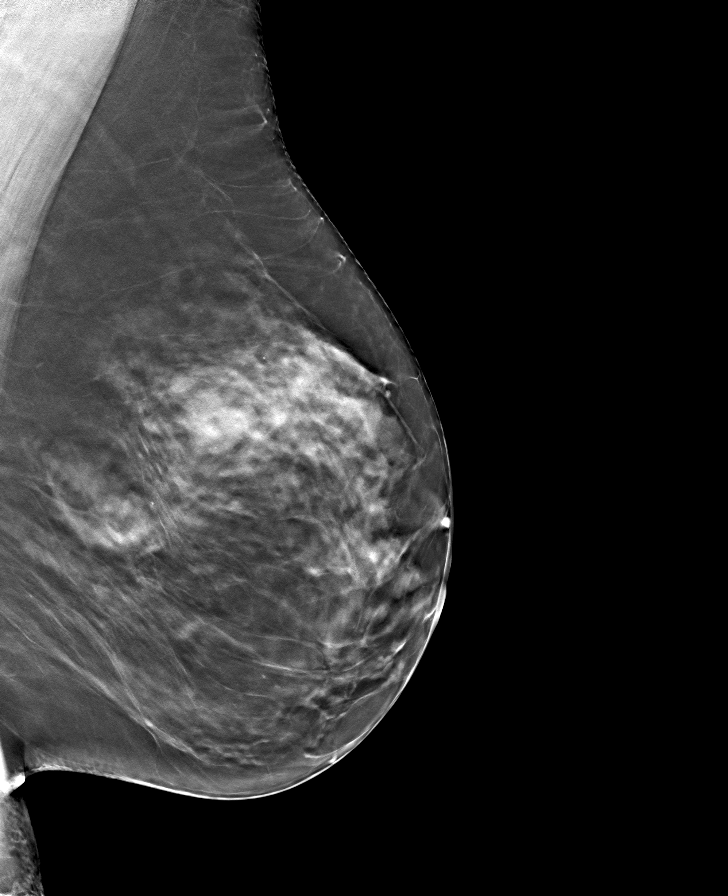

[8 of 24 positions shown; findings below may reference images not displayed]

ACR Breast Density Category c: The breast tissue is heterogeneously
dense, which may obscure small masses.
FINDINGS: There are no findings suspicious for malignancy.
IMPRESSION: No mammographic evidence of malignancy. A result letter of this
screening mammogram will be mailed directly to the patient.

RECOMMENDATION:
Screening mammogram in one year. (Code:[id number])

BI-RADS CATEGORY  1: Negative.

## 2022-11-11 ENCOUNTER — Other Ambulatory Visit: Payer: Self-pay | Admitting: Internal Medicine

## 2022-11-11 DIAGNOSIS — M542 Cervicalgia: Secondary | ICD-10-CM

## 2022-12-12 ENCOUNTER — Ambulatory Visit: Payer: 59 | Admitting: Internal Medicine

## 2022-12-12 VITALS — BP 126/72 | HR 98 | Ht 65.0 in | Wt 217.0 lb

## 2022-12-12 DIAGNOSIS — E782 Mixed hyperlipidemia: Secondary | ICD-10-CM | POA: Diagnosis not present

## 2022-12-12 DIAGNOSIS — I10 Essential (primary) hypertension: Secondary | ICD-10-CM

## 2022-12-12 DIAGNOSIS — H6123 Impacted cerumen, bilateral: Secondary | ICD-10-CM

## 2022-12-12 DIAGNOSIS — Z1211 Encounter for screening for malignant neoplasm of colon: Secondary | ICD-10-CM

## 2022-12-12 DIAGNOSIS — R7303 Prediabetes: Secondary | ICD-10-CM | POA: Insufficient documentation

## 2022-12-12 DIAGNOSIS — I440 Atrioventricular block, first degree: Secondary | ICD-10-CM | POA: Insufficient documentation

## 2022-12-12 DIAGNOSIS — Z1231 Encounter for screening mammogram for malignant neoplasm of breast: Secondary | ICD-10-CM

## 2022-12-12 DIAGNOSIS — F17200 Nicotine dependence, unspecified, uncomplicated: Secondary | ICD-10-CM

## 2022-12-12 MED ORDER — OLMESARTAN MEDOXOMIL-HCTZ 40-25 MG PO TABS
1.0000 | ORAL_TABLET | Freq: Every day | ORAL | 1 refills | Status: DC
Start: 2022-12-12 — End: 2023-11-30

## 2022-12-12 NOTE — Assessment & Plan Note (Signed)
Normal exam with stable BP on olmesartan and hctz. No concerns or side effects to current medication. No change in regimen; continue low sodium diet.

## 2022-12-12 NOTE — Progress Notes (Signed)
Date:  12/12/2022   Name:  Natalie Osborne   DOB:  [birth year]   MRN:  [id number]   Chief Complaint: Hypertension  Hypertension This is a chronic problem. The problem is controlled. Pertinent negatives include no chest pain, headaches, palpitations or shortness of breath. Past treatments include angiotensin blockers and diuretics.  Hyperlipidemia This is a chronic problem. Pertinent negatives include no chest pain or shortness of breath. Current antihyperlipidemic treatment includes diet change.  Heart block - first and second degree block was seen on her heart monitor while sleeping.  Cardiology was concerned about sleep apnea and she has just now been called to pick up the home study equipment.  Review of Systems  Constitutional:  Negative for chills, fatigue and unexpected weight change.  HENT:  Positive for hearing loss. Negative for nosebleeds.   Eyes:  Negative for visual disturbance.  Respiratory:  Negative for cough, chest tightness, shortness of breath and wheezing.   Cardiovascular:  Negative for chest pain, palpitations and leg swelling.  Gastrointestinal:  Negative for abdominal pain, constipation and diarrhea.  Musculoskeletal:  Negative for arthralgias and joint swelling.  Neurological:  Negative for dizziness, weakness, light-headedness and headaches.  Psychiatric/Behavioral:  Positive for sleep disturbance (non restorative sleep). Negative for dysphoric mood. The patient is not nervous/anxious.     Lab Results  Component Value Date   NA 140 08/23/2021   K 4.5 08/23/2021   CO2 24 08/23/2021   GLUCOSE 90 08/23/2021   BUN 17 08/23/2021   CREATININE 0.90 08/23/2021   CALCIUM 9.9 08/23/2021   EGFR 75 08/23/2021   GFRNONAA 95 06/26/2017   Lab Results  Component Value Date   CHOL 243 (H) 08/23/2021   HDL 60 08/23/2021   LDLCALC 151 (H) 08/23/2021   TRIG 178 (H) 08/23/2021   CHOLHDL 4.1 08/23/2021   Lab Results  Component Value Date   TSH 2.100 08/23/2021   Lab  Results  Component Value Date   HGBA1C 5.8 (H) 08/23/2021   Lab Results  Component Value Date   WBC 6.4 08/23/2021   HGB 13.5 08/23/2021   HCT 40.4 08/23/2021   MCV 91 08/23/2021   PLT 292 08/23/2021   Lab Results  Component Value Date   ALT 23 08/23/2021   AST 16 08/23/2021   ALKPHOS 75 08/23/2021   BILITOT 0.3 08/23/2021   Lab Results  Component Value Date   VD25OH 21.5 (L) 08/01/2020     Patient Active Problem List   Diagnosis Date Noted   Prediabetes 12/12/2022   Heart block AV first degree 12/12/2022   Tachycardia 04/11/2022   Essential hypertension 12/19/2020   Arthralgia of left temporomandibular joint 11/16/2020   Chest pain, atypical 11/16/2020   Cervical spondylosis with radiculopathy 09/24/2020   Cervicalgia 09/10/2020   Mixed hyperlipidemia 08/02/2020   Eczema of both hands 03/30/2017   Plantar fasciitis of right foot 03/30/2017   Tobacco dependency 03/30/2017   Menopausal and female climacteric states 03/30/2017   BMI 37.0-37.9, adult 03/30/2017    Allergies  Allergen Reactions   Ace Inhibitors Cough   Penicillins Rash and Other (See Comments)    Reaction as a kid.     Past Surgical History:  Procedure Laterality Date   CATARACT EXTRACTION  2011   BILATERAL   KNEE SURGERY  2001   Right Knee   TUBAL LIGATION      Social History   Tobacco Use   Smoking status: Every Day    Current packs/day: 1.00  Average packs/day: 1 pack/day for 42.0 years (42.0 ttl pk-yrs)    Types: Cigarettes   Smokeless tobacco: Never  Vaping Use   Vaping status: Never Used  Substance Use Topics   Alcohol use: Yes    Alcohol/week: 1.0 standard drink of alcohol    Types: 1 Shots of liquor per week   Drug use: Never     Medication list has been reviewed and updated.  Current Meds  Medication Sig   albuterol (VENTOLIN HFA) 108 (90 Base) MCG/ACT inhaler USE 2 INHALATIONS EVERY 6 HOURS AS NEEDED FOR WHEEZING OR SHORTNESS OF BREATH   clobetasol cream  (TEMOVATE) 0.05 % Apply 1 application topically 2 (two) times daily.   cyclobenzaprine (FLEXERIL) 10 MG tablet TAKE 1 TABLET AT BEDTIME   diclofenac (VOLTAREN) 50 MG EC tablet Take 50 mg by mouth 2 (two) times daily.   [DISCONTINUED] olmesartan-hydrochlorothiazide (BENICAR HCT) 40-25 MG tablet TAKE 1 TABLET DAILY   [DISCONTINUED] traMADol (ULTRAM) 50 MG tablet Take 50 mg by mouth every 4 (four) hours as needed.       12/12/2022    3:54 PM 04/11/2022    1:52 PM 08/23/2021   10:03 AM 02/04/2021   11:09 AM  GAD 7 : Generalized Anxiety Score  Nervous, Anxious, on Edge 0 0 0 0  Control/stop worrying 0 0 0 0  Worry too much - different things 0 0 0 0  Trouble relaxing 0 0 0 0  Restless 0 0 0 0  Easily annoyed or irritable 0 0 0 0  Afraid - awful might happen 0 0 0 0  Total GAD 7 Score 0 0 0 0  Anxiety Difficulty Not difficult at all Not difficult at all Not difficult at all Not difficult at all       12/12/2022    3:54 PM 04/11/2022    1:52 PM 08/23/2021   10:03 AM  Depression screen PHQ 2/9  Decreased Interest 0 2 0  Down, Depressed, Hopeless 0 0 0  PHQ - 2 Score 0 2 0  Altered sleeping 0 3 0  Tired, decreased energy 3 3 1   Change in appetite 0 0 0  Feeling bad or failure about yourself  0 0 0  Trouble concentrating 0 0 0  Moving slowly or fidgety/restless 0 0 0  Suicidal thoughts 0 0 0  PHQ-9 Score 3 8 1   Difficult doing work/chores Not difficult at all Not difficult at all Not difficult at all    BP Readings from Last 3 Encounters:  12/12/22 126/72  04/11/22 126/74  08/23/21 126/60    Physical Exam Vitals and nursing note reviewed.  Constitutional:      General: She is not in acute distress.    Appearance: She is well-developed.  HENT:     Head: Normocephalic and atraumatic.     Right Ear: Decreased hearing noted. There is impacted cerumen.     Left Ear: Decreased hearing noted. There is impacted cerumen.  Neck:     Vascular: No carotid bruit.  Cardiovascular:      Rate and Rhythm: Normal rate and regular rhythm.     Pulses: Normal pulses.  Pulmonary:     Effort: Pulmonary effort is normal. No respiratory distress.     Breath sounds: No wheezing or rhonchi.  Musculoskeletal:     Cervical back: Normal range of motion.     Right lower leg: No edema.     Left lower leg: No edema.  Lymphadenopathy:  Cervical: No cervical adenopathy.  Skin:    General: Skin is warm and dry.     Capillary Refill: Capillary refill takes less than 2 seconds.     Findings: No rash.  Neurological:     General: No focal deficit present.     Mental Status: She is alert and oriented to person, place, and time.  Psychiatric:        Mood and Affect: Mood normal.        Behavior: Behavior normal.     Wt Readings from Last 3 Encounters:  12/12/22 217 lb (98.4 kg)  04/11/22 212 lb (96.2 kg)  08/23/21 222 lb (100.7 kg)    BP 126/72 (BP Location: Left Arm, Cuff Size: Large)   Pulse 98   Ht 5\' 5"  (1.651 m)   Wt 217 lb (98.4 kg)   SpO2 95%   BMI 36.11 kg/m   Assessment and Plan:  Problem List Items Addressed This Visit       Unprioritized   Essential hypertension - Primary (Chronic)    Normal exam with stable BP on olmesartan and hctz. No concerns or side effects to current medication. No change in regimen; continue low sodium diet.       Relevant Medications   olmesartan-hydrochlorothiazide (BENICAR HCT) 40-25 MG tablet   Other Relevant Orders   CBC with Differential/Platelet   Comprehensive metabolic panel   Urinalysis, Routine w reflex microscopic   TSH   Heart block AV first degree   Relevant Medications   olmesartan-hydrochlorothiazide (BENICAR HCT) 40-25 MG tablet   Mixed hyperlipidemia (Chronic)    LDL is  Lab Results  Component Value Date   LDLCALC 151 (H) 08/23/2021   Currently being treated with diet only.       Relevant Medications   olmesartan-hydrochlorothiazide (BENICAR HCT) 40-25 MG tablet   Other Relevant Orders   Lipid  panel   Prediabetes    Lab Results  Component Value Date   HGBA1C 5.8 (H) 08/23/2021  Recommended low carb diet and exercise       Relevant Orders   Hemoglobin A1c   Tobacco dependency (Chronic)   Relevant Orders   Ambulatory Referral Lung Cancer Screening [facility]   Other Visit Diagnoses     Encounter for screening mammogram for breast cancer       Relevant Orders   MM 3D SCREENING MAMMOGRAM BILATERAL BREAST   Colon cancer screening       She is given FIT; then decided to do Cologuard. She will decide and submit one of the two for screening   Relevant Orders   Fecal occult blood, imunochemical   Cologuard   Bilateral hearing loss due to cerumen impaction       Relevant Orders   Ambulatory referral to ENT       Return in about 6 months (around 06/12/2023) for HTN.    Reubin Milan, MD [facility] [place]

## 2022-12-12 NOTE — Patient Instructions (Signed)
Call [facility] to schedule your mammogram at [phone].  

## 2022-12-12 NOTE — Assessment & Plan Note (Signed)
LDL is  Lab Results  Component Value Date   LDLCALC 151 (H) 08/23/2021   Currently being treated with diet only.

## 2022-12-12 NOTE — Assessment & Plan Note (Signed)
Lab Results  Component Value Date   HGBA1C 5.8 (H) 08/23/2021  Recommended low carb diet and exercise

## 2022-12-13 LAB — URINALYSIS, ROUTINE W REFLEX MICROSCOPIC
Bilirubin, UA: NEGATIVE
Glucose, UA: NEGATIVE
Ketones, UA: NEGATIVE
Leukocytes,UA: NEGATIVE
Nitrite, UA: NEGATIVE
Protein,UA: NEGATIVE
RBC, UA: NEGATIVE
Specific Gravity, UA: 1.008 (ref 1.005–1.030)
Urobilinogen, Ur: 0.2 mg/dL (ref 0.2–1.0)
pH, UA: 6 (ref 5.0–7.5)

## 2022-12-13 LAB — LIPID PANEL
Chol/HDL Ratio: 3.8 {ratio} (ref 0.0–4.4)
Cholesterol, Total: 270 mg/dL — ABNORMAL HIGH (ref 100–199)
HDL: 72 mg/dL (ref >39)
LDL Chol Calc (NIH): 176 mg/dL — ABNORMAL HIGH (ref 0–99)
Triglycerides: 127 mg/dL (ref 0–149)
VLDL Cholesterol Cal: 22 mg/dL (ref 5–40)

## 2022-12-13 LAB — CBC WITH DIFFERENTIAL/PLATELET
Basophils Absolute: 0.1 10*3/uL (ref 0.0–0.2)
Basos: 1 %
EOS (ABSOLUTE): 0.3 10*3/uL (ref 0.0–0.4)
Eos: 3 %
Hematocrit: 39.7 % (ref 34.0–46.6)
Hemoglobin: 13.5 g/dL (ref 11.1–15.9)
Immature Grans (Abs): 0 10*3/uL (ref 0.0–0.1)
Immature Granulocytes: 0 %
Lymphocytes Absolute: 2.6 10*3/uL (ref 0.7–3.1)
Lymphs: 32 %
MCH: 30.9 pg (ref 26.6–33.0)
MCHC: 34 g/dL (ref 31.5–35.7)
MCV: 91 fL (ref 79–97)
Monocytes Absolute: 0.6 10*3/uL (ref 0.1–0.9)
Monocytes: 8 %
Neutrophils Absolute: 4.5 10*3/uL (ref 1.4–7.0)
Neutrophils: 56 %
Platelets: 284 10*3/uL (ref 150–450)
RBC: 4.37 x10E6/uL (ref 3.77–5.28)
RDW: 12.3 % (ref 11.7–15.4)
WBC: 8 10*3/uL (ref 3.4–10.8)

## 2022-12-13 LAB — COMPREHENSIVE METABOLIC PANEL
ALT: 24 [IU]/L (ref 0–32)
AST: 21 [IU]/L (ref 0–40)
Albumin: 4.4 g/dL (ref 3.8–4.9)
Alkaline Phosphatase: 89 [IU]/L (ref 44–121)
BUN/Creatinine Ratio: 16 (ref 9–23)
BUN: 16 mg/dL (ref 6–24)
Bilirubin Total: 0.3 mg/dL (ref 0.0–1.2)
CO2: 25 mmol/L (ref 20–29)
Calcium: 10.1 mg/dL (ref 8.7–10.2)
Chloride: 101 mmol/L (ref 96–106)
Creatinine, Ser: 0.97 mg/dL (ref 0.57–1.00)
Globulin, Total: 3.2 g/dL (ref 1.5–4.5)
Glucose: 104 mg/dL — ABNORMAL HIGH (ref 70–99)
Potassium: 4.1 mmol/L (ref 3.5–5.2)
Sodium: 140 mmol/L (ref 134–144)
Total Protein: 7.6 g/dL (ref 6.0–8.5)
eGFR: 68 mL/min/{1.73_m2} (ref >59)

## 2022-12-13 LAB — TSH: TSH: 2.11 u[IU]/mL (ref 0.450–4.500)

## 2022-12-13 LAB — HEMOGLOBIN A1C
Est. average glucose Bld gHb Est-mCnc: 134 mg/dL
Hgb A1c MFr Bld: 6.3 % — ABNORMAL HIGH (ref 4.8–5.6)

## 2022-12-27 DIAGNOSIS — G4733 Obstructive sleep apnea (adult) (pediatric): Secondary | ICD-10-CM | POA: Insufficient documentation

## 2023-01-10 LAB — COLOGUARD: COLOGUARD: NEGATIVE

## 2023-04-22 ENCOUNTER — Other Ambulatory Visit: Payer: Self-pay | Admitting: Internal Medicine

## 2023-04-22 DIAGNOSIS — M542 Cervicalgia: Secondary | ICD-10-CM

## 2023-04-22 NOTE — Telephone Encounter (Signed)
Requested Prescriptions  Pending Prescriptions Disp Refills   cyclobenzaprine (FLEXERIL) 10 MG tablet [Pharmacy Med Name: CYCLOBENZAPRINE HCL TABS 10MG ] 90 tablet 3    Sig: TAKE 1 TABLET AT BEDTIME     Not Delegated - Analgesics:  Muscle Relaxants Failed - 04/22/2023  5:31 PM      Failed - This refill cannot be delegated      Passed - Valid encounter within last 6 months    Recent Outpatient Visits           4 months ago Essential hypertension   [place] [facility] at [facility], Nyoka Cowden, MD   1 year ago Tachycardia   [facility] at [facility], Nyoka Cowden, MD   1 year ago Annual physical exam   [facility] Medicine at [facility], Nyoka Cowden, MD   2 years ago Essential hypertension   [place] [facility] at [facility], Nyoka Cowden, MD   2 years ago Essential hypertension   [place] [facility] at [facility], Nyoka Cowden, MD               albuterol (VENTOLIN HFA) 108 (90 Base) MCG/ACT inhaler [Pharmacy Med Name: ALBUTEROL(PROAIR) HFA INHALER 90MCG] 8.5 g 10    Sig: USE 2 INHALATIONS EVERY 6 HOURS AS NEEDED FOR WHEEZING OR SHORTNESS OF BREATH     Pulmonology:  Beta Agonists 2 Passed - 04/22/2023  5:31 PM      Passed - Last BP in normal range    BP Readings from Last 1 Encounters:  12/12/22 126/72         Passed - Last Heart Rate in normal range    Pulse Readings from Last 1 Encounters:  12/12/22 98         Passed - Valid encounter within last 12 months    Recent Outpatient Visits           4 months ago Essential hypertension   [place] [facility] at [facility], Nyoka Cowden, MD   1 year ago Tachycardia   [facility] at [facility], Nyoka Cowden, MD   1 year ago Annual physical exam   [facility] at [facility], Nyoka Cowden, MD   2 years ago Essential hypertension    Primary Care & Sports Medicine at [facility], Nyoka Cowden, MD   2 years ago Essential hypertension   [facility] Medicine at [facility], Nyoka Cowden, MD

## 2023-04-22 NOTE — Telephone Encounter (Signed)
Requested medication (s) are due for refill today: yes  Requested medication (s) are on the active medication list: yes  Last refill:  11/11/22 #90/0  Future visit scheduled: yes  Notes to clinic:  Unable to refill per protocol, cannot delegate.    Requested Prescriptions  Pending Prescriptions Disp Refills   cyclobenzaprine (FLEXERIL) 10 MG tablet [Pharmacy Med Name: CYCLOBENZAPRINE HCL TABS 10MG ] 90 tablet 3    Sig: TAKE 1 TABLET AT BEDTIME     Not Delegated - Analgesics:  Muscle Relaxants Failed - 04/22/2023  5:32 PM      Failed - This refill cannot be delegated      Passed - Valid encounter within last 6 months    Recent Outpatient Visits           4 months ago Essential hypertension   [place] [facility] at [facility], Nyoka Cowden, MD   1 year ago Tachycardia   [facility] at [facility], Nyoka Cowden, MD   1 year ago Annual physical exam   [facility] at [facility], Nyoka Cowden, MD   2 years ago Essential hypertension   [place] [facility] at [facility], Nyoka Cowden, MD   2 years ago Essential hypertension   [place] [facility] at [facility], Nyoka Cowden, MD              Signed Prescriptions Disp Refills   albuterol (VENTOLIN HFA) 108 (90 Base) MCG/ACT inhaler 8.5 g 1    Sig: USE 2 INHALATIONS EVERY 6 HOURS AS NEEDED FOR WHEEZING OR SHORTNESS OF BREATH     Pulmonology:  Beta Agonists 2 Passed - 04/22/2023  5:32 PM      Passed - Last BP in normal range    BP Readings from Last 1 Encounters:  12/12/22 126/72         Passed - Last Heart Rate in normal range    Pulse Readings from Last 1 Encounters:  12/12/22 98         Passed - Valid encounter within last 12 months    Recent Outpatient Visits           4 months ago Essential hypertension   [place] [facility] at [facility], Nyoka Cowden, MD   1 year ago Tachycardia   [facility] Primary Care & Sports Medicine at [facility], Nyoka Cowden, MD   1 year ago Annual physical exam   [facility] Primary Care & Sports Medicine at [facility], Nyoka Cowden, MD   2 years ago Essential hypertension   [place] [facility] Medicine at [facility], Nyoka Cowden, MD   2 years ago Essential hypertension   [facility] Medicine at [facility], Nyoka Cowden, MD

## 2023-06-22 ENCOUNTER — Other Ambulatory Visit: Payer: Self-pay | Admitting: Internal Medicine

## 2023-06-22 DIAGNOSIS — M542 Cervicalgia: Secondary | ICD-10-CM

## 2023-06-23 NOTE — Telephone Encounter (Signed)
 Requested medication (s) are due for refill today: yes  Requested medication (s) are on the active medication list: yes  Last refill:  04/23/23  Future visit scheduled: no  Notes to clinic:  Unable to refill per protocol, cannot delegate.      Requested Prescriptions  Pending Prescriptions Disp Refills   cyclobenzaprine  (FLEXERIL ) 10 MG tablet [Pharmacy Med Name: CYCLOBENZAPRINE  HCL TABS 10MG ] 30 tablet 11    Sig: TAKE 1 TABLET AT BEDTIME     Not Delegated - Analgesics:  Muscle Relaxants Failed - 06/23/2023  9:43 AM      Failed - This refill cannot be delegated      Failed - Valid encounter within last 6 months    Recent Outpatient Visits   None

## 2023-11-06 ENCOUNTER — Other Ambulatory Visit: Payer: Self-pay | Admitting: Internal Medicine

## 2023-11-06 DIAGNOSIS — I10 Essential (primary) hypertension: Secondary | ICD-10-CM

## 2023-11-09 NOTE — Telephone Encounter (Signed)
 Requested medications are due for refill today.  yes  Requested medications are on the active medications list.  yes  Last refill. Albuterol  04/22/2023, Benicar  12/12/2022  Future visit scheduled.   no  Notes to clinic.  Pt last seen 12/12/2022    Requested Prescriptions  Pending Prescriptions Disp Refills   olmesartan -hydrochlorothiazide (BENICAR  HCT) 40-25 MG tablet [Pharmacy Med Name: OLMESARTAN /HCTZ TABS 40/25MG ] 90 tablet 3    Sig: TAKE 1 TABLET DAILY     Cardiovascular: ARB + Diuretic Combos Failed - 11/09/2023  7:40 AM      Failed - K in normal range and within 180 days    Potassium  Date Value Ref Range Status  12/12/2022 4.1 3.5 - 5.2 mmol/L Final         Failed - Na in normal range and within 180 days    Sodium  Date Value Ref Range Status  12/12/2022 140 134 - 144 mmol/L Final         Failed - Cr in normal range and within 180 days    Creatinine, Ser  Date Value Ref Range Status  12/12/2022 0.97 0.57 - 1.00 mg/dL Final         Failed - eGFR is 10 or above and within 180 days    GFR calc Af Amer  Date Value Ref Range Status  06/26/2017 110 >59 mL/min/1.73 Final   GFR calc non Af Amer  Date Value Ref Range Status  06/26/2017 95 >59 mL/min/1.73 Final   eGFR  Date Value Ref Range Status  12/12/2022 68 >59 mL/min/1.73 Final         Failed - Valid encounter within last 6 months    Recent Outpatient Visits   None            Passed - Patient is not pregnant      Passed - Last BP in normal range    BP Readings from Last 1 Encounters:  12/12/22 126/72          albuterol  (VENTOLIN  HFA) 108 (90 Base) MCG/ACT inhaler [Pharmacy Med Name: ALBUTEROL (PROAIR ) HFA INHALER 90MCG] 8.5 g 10    Sig: USE 2 INHALATIONS EVERY 6 HOURS AS NEEDED FOR WHEEZING OR SHORTNESS OF BREATH     Pulmonology:  Beta Agonists 2 Failed - 11/09/2023  7:40 AM      Failed - Valid encounter within last 12 months    Recent Outpatient Visits   None            Passed - Last BP in  normal range    BP Readings from Last 1 Encounters:  12/12/22 126/72         Passed - Last Heart Rate in normal range    Pulse Readings from Last 1 Encounters:  12/12/22 98

## 2023-11-30 ENCOUNTER — Encounter: Payer: Self-pay | Admitting: Internal Medicine

## 2023-11-30 ENCOUNTER — Other Ambulatory Visit: Payer: Self-pay

## 2023-11-30 DIAGNOSIS — I10 Essential (primary) hypertension: Secondary | ICD-10-CM

## 2023-11-30 MED ORDER — OLMESARTAN MEDOXOMIL-HCTZ 40-25 MG PO TABS: 1.0000 | ORAL_TABLET | Freq: Every day | ORAL | 0 refills | Status: DC

## 2023-12-14 ENCOUNTER — Ambulatory Visit: Admitting: Internal Medicine

## 2023-12-16 ENCOUNTER — Ambulatory Visit
Admission: RE | Admit: 2023-12-16 | Discharge: 2023-12-16 | Disposition: A | Discharge status: Home / Self Care | Source: Ambulatory Visit | Attending: Internal Medicine | Admitting: Internal Medicine

## 2023-12-16 DIAGNOSIS — Z1231 Encounter for screening mammogram for malignant neoplasm of breast: Secondary | ICD-10-CM | POA: Insufficient documentation

## 2023-12-18 ENCOUNTER — Encounter: Payer: Self-pay | Admitting: Internal Medicine

## 2023-12-18 ENCOUNTER — Ambulatory Visit (INDEPENDENT_AMBULATORY_CARE_PROVIDER_SITE_OTHER): Admitting: Internal Medicine

## 2023-12-18 ENCOUNTER — Other Ambulatory Visit (HOSPITAL_COMMUNITY)
Admission: RE | Admit: 2023-12-18 | Discharge: 2023-12-18 | Disposition: A | Discharge status: Home / Self Care | Source: Ambulatory Visit | Attending: Internal Medicine | Admitting: Internal Medicine

## 2023-12-18 VITALS — BP 112/84 | HR 74 | Ht 65.0 in | Wt 215.0 lb

## 2023-12-18 DIAGNOSIS — I10 Essential (primary) hypertension: Secondary | ICD-10-CM

## 2023-12-18 DIAGNOSIS — E559 Vitamin D deficiency, unspecified: Secondary | ICD-10-CM | POA: Insufficient documentation

## 2023-12-18 DIAGNOSIS — F17200 Nicotine dependence, unspecified, uncomplicated: Secondary | ICD-10-CM

## 2023-12-18 DIAGNOSIS — B356 Tinea cruris: Secondary | ICD-10-CM | POA: Diagnosis present

## 2023-12-18 DIAGNOSIS — M542 Cervicalgia: Secondary | ICD-10-CM

## 2023-12-18 DIAGNOSIS — Z23 Encounter for immunization: Secondary | ICD-10-CM

## 2023-12-18 DIAGNOSIS — Z0000 Encounter for general adult medical examination without abnormal findings: Secondary | ICD-10-CM | POA: Insufficient documentation

## 2023-12-18 DIAGNOSIS — K219 Gastro-esophageal reflux disease without esophagitis: Secondary | ICD-10-CM | POA: Insufficient documentation

## 2023-12-18 DIAGNOSIS — R7303 Prediabetes: Secondary | ICD-10-CM

## 2023-12-18 DIAGNOSIS — E782 Mixed hyperlipidemia: Secondary | ICD-10-CM

## 2023-12-18 DIAGNOSIS — N3281 Overactive bladder: Secondary | ICD-10-CM | POA: Insufficient documentation

## 2023-12-18 DIAGNOSIS — Z124 Encounter for screening for malignant neoplasm of cervix: Secondary | ICD-10-CM

## 2023-12-18 LAB — POCT URINALYSIS DIPSTICK
Bilirubin, UA: NEGATIVE
Blood, UA: NEGATIVE
Glucose, UA: NEGATIVE
Ketones, UA: NEGATIVE
Leukocytes, UA: NEGATIVE
Nitrite, UA: NEGATIVE
Protein, UA: NEGATIVE
Spec Grav, UA: 1.015 (ref 1.010–1.025)
Urobilinogen, UA: 0.2 U/dL
pH, UA: 6 (ref 5.0–8.0)

## 2023-12-18 MED ORDER — OLMESARTAN MEDOXOMIL-HCTZ 40-25 MG PO TABS: 1.0000 | ORAL_TABLET | Freq: Every day | ORAL | 0 refills | Status: AC

## 2023-12-18 MED ORDER — CLOTRIMAZOLE-BETAMETHASONE 1-0.05 % EX CREA: 1.0000 | TOPICAL_CREAM | Freq: Every day | CUTANEOUS | 0 refills | Status: AC

## 2023-12-18 MED ORDER — ALBUTEROL SULFATE HFA 108 (90 BASE) MCG/ACT IN AERS: 2.0000 | INHALATION_SPRAY | RESPIRATORY_TRACT | 1 refills | Status: AC | PRN

## 2023-12-18 MED ORDER — VARENICLINE TARTRATE (STARTER) 0.5 MG X 11 & 1 MG X 42 PO TBPK: ORAL_TABLET | ORAL | 0 refills | Status: AC

## 2023-12-18 MED ORDER — CYCLOBENZAPRINE HCL 10 MG PO TABS: 10.0000 mg | ORAL_TABLET | Freq: Every day | ORAL | 0 refills | Status: AC

## 2023-12-18 MED ORDER — SOLIFENACIN SUCCINATE 5 MG PO TABS: 5.0000 mg | ORAL_TABLET | Freq: Every day | ORAL | 0 refills | Status: AC

## 2023-12-18 NOTE — Assessment & Plan Note (Signed)
 Wants to have LDCT but needs [facility] instead of [facility].

## 2023-12-18 NOTE — Assessment & Plan Note (Signed)
 Last value very low at 21. Recommend daily supplement. Recheck level.

## 2023-12-18 NOTE — Assessment & Plan Note (Signed)
 Classic symptoms with negative UA Trial of Vesicare

## 2023-12-18 NOTE — Progress Notes (Signed)
 Date:  12/18/2023   Name:  Natalie Osborne   DOB:  06-Dec-1965   MRN:  [id number]   Chief Complaint: Annual Exam (Wants CT Lung Cancer Screening but needs it sent to [facility] for insurance purposes.) Caitlen L Gouveia is a 58 y.o. female who presents today for her Complete Annual Exam. She feels fairly well. She reports exercising- not. She reports she is sleeping fairly well. Breast complaints - none.  Health Maintenance  Topic Date Due   HIV Screening  Never done   DTaP/Tdap/Td vaccine (1 - Tdap) Never done   Pneumococcal Vaccine for age over 44 (1 of 2 - PCV) Never done   Hepatitis B Vaccine (1 of 3 - 19+ 3-dose series) Never done   Zoster (Shingles) Vaccine (1 of 2) Never done   Breast Cancer Screening  06/18/2022   Pap with HPV screening  06/27/2022   Screening for Lung Cancer  07/23/2022   COVID-19 Vaccine (5 - 2025-26 season) 11/02/2023   Cologuard (Stool DNA test)  01/01/2026   Flu Shot  Completed   Hepatitis C Screening  Completed   HPV Vaccine  Aged Out   Meningitis B Vaccine  Aged Out   Hypertension This is a chronic problem. The problem is controlled. Pertinent negatives include no chest pain, headaches, palpitations or shortness of breath. Past treatments include angiotensin blockers and diuretics. The current treatment provides significant improvement. There is no history of kidney disease, CAD/MI or CVA.  Gastroesophageal Reflux She complains of heartburn. She reports no abdominal pain, no chest pain, no coughing or no wheezing. This is a recurrent problem. The problem occurs frequently. The problem has been unchanged. The heartburn duration is several minutes. The heartburn is located in the substernum. The heartburn is of moderate intensity. The heartburn wakes her from sleep. The heartburn does not limit her activity. The heartburn changes with position. Associated symptoms include fatigue. She has tried an antacid for the symptoms. The treatment provided moderate relief.  Mild OSA  - on sleep study last year.  Never followed up for treatment.  Sleeps poorly, fatigued, aches and pains. Tobacco use - wants to quit, tried patches, chantix.  Also doing LDCT screening but needs to go to [facility]. Rash- in groin and labia.  No vaginal discharge, no bleeding.   OAB - has classic symptoms.   UA negative.  Consider treatment trial with Vesicare, etc.   Review of Systems  Constitutional:  Positive for fatigue. Negative for unexpected weight change.  HENT:  Negative for trouble swallowing.   Eyes:  Negative for visual disturbance.  Respiratory:  Negative for cough, chest tightness, shortness of breath and wheezing.   Cardiovascular:  Negative for chest pain, palpitations and leg swelling.  Gastrointestinal:  Positive for heartburn. Negative for abdominal pain, constipation and diarrhea.       Frequent heartburn   Genitourinary:  Positive for frequency and vaginal discharge. Negative for dysuria and menstrual problem.  Musculoskeletal:  Positive for arthralgias and myalgias.  Skin:  Positive for rash.  Neurological:  Negative for dizziness, weakness, light-headedness and headaches.  Psychiatric/Behavioral:  Positive for sleep disturbance. Negative for dysphoric mood. The patient is not nervous/anxious.      Lab Results  Component Value Date   NA 140 12/12/2022   K 4.1 12/12/2022   CO2 25 12/12/2022   GLUCOSE 104 (H) 12/12/2022   BUN 16 12/12/2022   CREATININE 0.97 12/12/2022   CALCIUM 10.1 12/12/2022   EGFR 68 12/12/2022  GFRNONAA 95 06/26/2017   Lab Results  Component Value Date   CHOL 270 (H) 12/12/2022   HDL 72 12/12/2022   LDLCALC 176 (H) 12/12/2022   TRIG 127 12/12/2022   CHOLHDL 3.8 12/12/2022   Lab Results  Component Value Date   TSH 2.110 12/12/2022   Lab Results  Component Value Date   HGBA1C 6.3 (H) 12/12/2022   Lab Results  Component Value Date   WBC 8.0 12/12/2022   HGB 13.5 12/12/2022   HCT 39.7 12/12/2022   MCV 91 12/12/2022   PLT 284  12/12/2022   Lab Results  Component Value Date   ALT 24 12/12/2022   AST 21 12/12/2022   ALKPHOS 89 12/12/2022   BILITOT 0.3 12/12/2022   Lab Results  Component Value Date   VD25OH 21.5 (L) 08/01/2020     Patient Active Problem List   Diagnosis Date Noted   Vitamin D  deficiency 12/18/2023   OAB (overactive bladder) 12/18/2023   Gastroesophageal reflux disease 12/18/2023   Mild obstructive sleep apnea 12/27/2022   Prediabetes 12/12/2022   Heart block AV first degree 12/12/2022   Tachycardia 04/11/2022   Essential hypertension 12/19/2020   Arthralgia of left temporomandibular joint 11/16/2020   Chest pain, atypical 11/16/2020   Cervical spondylosis with radiculopathy 09/24/2020   Cervicalgia 09/10/2020   Mixed hyperlipidemia 08/02/2020   Eczema of both hands 03/30/2017   Plantar fasciitis of right foot 03/30/2017   Tobacco dependency 03/30/2017   Menopausal and female climacteric states 03/30/2017   BMI 37.0-37.9, adult 03/30/2017    Allergies  Allergen Reactions   Ace Inhibitors Cough   Penicillins Rash and Other (See Comments)    Reaction as a kid.     Past Surgical History:  Procedure Laterality Date   CATARACT EXTRACTION  2011   BILATERAL   KNEE SURGERY  2001   Right Knee   TUBAL LIGATION      Social History   Tobacco Use   Smoking status: Every Day    Current packs/day: 1.00    Average packs/day: 1 pack/day for 42.0 years (42.0 ttl pk-yrs)    Types: Cigarettes   Smokeless tobacco: Never  Vaping Use   Vaping status: Never Used  Substance Use Topics   Alcohol use: Yes    Alcohol/week: 1.0 standard drink of alcohol    Types: 1 Shots of liquor per week   Drug use: Never     Medication list has been reviewed and updated.  Current Meds  Medication Sig   clotrimazole-betamethasone (LOTRISONE) cream Apply 1 Application topically daily. To groin rash   diclofenac  (VOLTAREN ) 50 MG EC tablet Take 50 mg by mouth 2 (two) times daily.   solifenacin  (VESICARE) 5 MG tablet Take 1 tablet (5 mg total) by mouth daily.   tazarotene (AVAGE) 0.1 % cream Apply 1 application topically in the morning and at bedtime.   Varenicline Tartrate, Starter, (CHANTIX STARTING MONTH PAK) 0.5 MG X 11 & 1 MG X 42 TBPK Take as directed   [DISCONTINUED] albuterol  (VENTOLIN  HFA) 108 (90 Base) MCG/ACT inhaler USE 2 INHALATIONS EVERY 6 HOURS AS NEEDED FOR WHEEZING OR SHORTNESS OF BREATH   [DISCONTINUED] clobetasol cream (TEMOVATE) 0.05 % Apply 1 application topically 2 (two) times daily.   [DISCONTINUED] cyclobenzaprine  (FLEXERIL ) 10 MG tablet TAKE 1 TABLET AT BEDTIME   [DISCONTINUED] olmesartan -hydrochlorothiazide (BENICAR  HCT) 40-25 MG tablet Take 1 tablet by mouth daily.       12/18/2023    8:31 AM 12/12/2022  3:54 PM 04/11/2022    1:52 PM 08/23/2021   10:03 AM  GAD 7 : Generalized Anxiety Score  Nervous, Anxious, on Edge 0 0 0 0  Control/stop worrying 0 0 0 0  Worry too much - different things 0 0 0 0  Trouble relaxing 0 0 0 0  Restless 0 0 0 0  Easily annoyed or irritable 0 0 0 0  Afraid - awful might happen 0 0 0 0  Total GAD 7 Score 0 0 0 0  Anxiety Difficulty Not difficult at all Not difficult at all Not difficult at all Not difficult at all       12/18/2023    8:31 AM 12/12/2022    3:54 PM 04/11/2022    1:52 PM  Depression screen PHQ 2/9  Decreased Interest 0 0 2  Down, Depressed, Hopeless 0 0 0  PHQ - 2 Score 0 0 2  Altered sleeping 0 0 3  Tired, decreased energy 0 3 3  Change in appetite 0 0 0  Feeling bad or failure about yourself  0 0 0  Trouble concentrating 0 0 0  Moving slowly or fidgety/restless 0 0 0  Suicidal thoughts 0 0 0  PHQ-9 Score 0 3 8  Difficult doing work/chores Not difficult at all Not difficult at all Not difficult at all    BP Readings from Last 3 Encounters:  12/18/23 112/84  12/12/22 126/72  04/11/22 126/74    Physical Exam Vitals and nursing note reviewed.  Constitutional:      General: She is not in  acute distress.    Appearance: Normal appearance. She is well-developed.  HENT:     Head: Normocephalic and atraumatic.     Right Ear: Tympanic membrane and ear canal normal.     Left Ear: Tympanic membrane and ear canal normal.     Nose:     Right Sinus: No maxillary sinus tenderness.     Left Sinus: No maxillary sinus tenderness.  Eyes:     General: No scleral icterus.       Right eye: No discharge.        Left eye: No discharge.     Conjunctiva/sclera: Conjunctivae normal.  Neck:     Thyroid: No thyromegaly.     Vascular: No carotid bruit.  Cardiovascular:     Rate and Rhythm: Normal rate and regular rhythm.     Pulses: Normal pulses.     Heart sounds: Normal heart sounds.  Pulmonary:     Effort: Pulmonary effort is normal. No respiratory distress.     Breath sounds: No wheezing.  Abdominal:     General: Bowel sounds are normal.     Palpations: Abdomen is soft. There is no mass.     Tenderness: There is no abdominal tenderness. There is no guarding or rebound.  Genitourinary:    Labia:        Right: Rash present. No tenderness or injury.        Left: Rash present. No tenderness or injury.      Vagina: Normal.     Cervix: Normal.     Uterus: Normal.      Adnexa: Right adnexa normal and left adnexa normal.      Comments: Pap obtained Aptima swab obtained Musculoskeletal:     Cervical back: Normal range of motion. No erythema.     Right lower leg: No edema.     Left lower leg: No edema.  Lymphadenopathy:  Cervical: No cervical adenopathy.  Skin:    General: Skin is warm and dry.     Capillary Refill: Capillary refill takes less than 2 seconds.     Findings: No rash.  Neurological:     Mental Status: She is alert and oriented to person, place, and time.     Cranial Nerves: No cranial nerve deficit.     Sensory: No sensory deficit.     Deep Tendon Reflexes: Reflexes are normal and symmetric.  Psychiatric:        Attention and Perception: Attention normal.         Mood and Affect: Mood normal.     Wt Readings from Last 3 Encounters:  12/18/23 215 lb (97.5 kg)  12/12/22 217 lb (98.4 kg)  04/11/22 212 lb (96.2 kg)    BP 112/84   Pulse 74   Ht 5' 5 (1.651 m)   Wt 215 lb (97.5 kg)   SpO2 97%   BMI 35.78 kg/m   Assessment and Plan:  Problem List Items Addressed This Visit       Unprioritized   Tobacco dependency (Chronic)   Wants to have LDCT but needs [facility] instead of [facility].       Relevant Medications   albuterol  (VENTOLIN  HFA) 108 (90 Base) MCG/ACT inhaler   Varenicline Tartrate, Starter, (CHANTIX STARTING MONTH PAK) 0.5 MG X 11 & 1 MG X 42 TBPK   Mixed hyperlipidemia (Chronic)   Currently managing with diet only. 10 yr risk average but may need to consider statin therapy if increasing. Lab Results  Component Value Date   LDLCALC 176 (H) 12/12/2022         Relevant Medications   olmesartan -hydrochlorothiazide (BENICAR  HCT) 40-25 MG tablet   Other Relevant Orders   Lipid panel   Cervicalgia   Relevant Medications   cyclobenzaprine  (FLEXERIL ) 10 MG tablet   Essential hypertension (Chronic)   Well controlled blood pressure today. Current regimen is olmesartan  and hydrochlorothiazide. No medication side effects noted.        Relevant Medications   olmesartan -hydrochlorothiazide (BENICAR  HCT) 40-25 MG tablet   Other Relevant Orders   CBC with Differential/Platelet   Comprehensive metabolic panel with GFR   TSH   POCT urinalysis dipstick   Prediabetes   Gradually worsening. Lab Results  Component Value Date   HGBA1C 6.3 (H) 12/12/2022  Continue efforts with diet and exercise.      Relevant Orders   Hemoglobin A1c   Vitamin D  deficiency   Last value very low at 21. Recommend daily supplement. Recheck level.      Relevant Orders   VITAMIN D  25 Hydroxy (Vit-D Deficiency, Fractures)   OAB (overactive bladder)   Classic symptoms with negative UA Trial of Vesicare       Relevant Medications   solifenacin  (VESICARE) 5 MG tablet   Gastroesophageal reflux disease   Recommend Pepcid 10 mg daily for 4 weeks then trial off of medications. Should follow up if worsening.      Other Visit Diagnoses       Annual physical exam    -  Primary   MM done this week Cologuard negative last year   Relevant Orders   CBC with Differential/Platelet   Comprehensive metabolic panel with GFR   Cytology - PAP   Lipid panel   TSH   VITAMIN D  25 Hydroxy (Vit-D Deficiency, Fractures)   Hemoglobin A1c   Cervicovaginal ancillary only   POCT urinalysis dipstick  Encounter for screening for cervical cancer       Relevant Orders   Cytology - PAP     Tinea cruris       Relevant Medications   clotrimazole-betamethasone (LOTRISONE) cream   Other Relevant Orders   Cervicovaginal ancillary only     Encounter for immunization       Relevant Orders   Flu vaccine trivalent PF, 6mos and older(Flulaval,Afluria,Fluarix,Fluzone) (Completed)       Return in about 6 months (around 06/17/2024) for [facility]  [facility]  Dr. Lemon.    Leita HILARIO Adie, MD [facility] [place]

## 2023-12-18 NOTE — Assessment & Plan Note (Addendum)
 Currently managing with diet only. 10 yr risk average but may need to consider statin therapy if increasing. Lab Results  Component Value Date   LDLCALC 176 (H) 12/12/2022

## 2023-12-18 NOTE — Assessment & Plan Note (Signed)
 Gradually worsening. Lab Results  Component Value Date   HGBA1C 6.3 (H) 12/12/2022  Continue efforts with diet and exercise.

## 2023-12-18 NOTE — Assessment & Plan Note (Signed)
 Recommend Pepcid 10 mg daily for 4 weeks then trial off of medications. Should follow up if worsening.

## 2023-12-18 NOTE — Patient Instructions (Addendum)
 Georgina Camellia Senior, MD - Call to get sleep referral [address]  [address]  [place], [place] [address]  Phone: tel:[phone]  fax:[phone]   Take vitamin D  daily - 1000 international units every day.  Try Vesicare 5 mg once a day for bladder symptoms.  [facility] should be calling you to schedule the lung CT  Take Pepcid 10 mg over the counter every day for heartburn.  Try it for at least 4 weeks then see if you still need it.  If reflux continues, you may need a prescription strength.

## 2023-12-18 NOTE — Assessment & Plan Note (Signed)
 Well controlled blood pressure today. Current regimen is olmesartan  and hydrochlorothiazide. No medication side effects noted.

## 2023-12-19 LAB — HEMOGLOBIN A1C
Est. average glucose Bld gHb Est-mCnc: 131 mg/dL
Hgb A1c MFr Bld: 6.2 % — ABNORMAL HIGH (ref 4.8–5.6)

## 2023-12-19 LAB — COMPREHENSIVE METABOLIC PANEL WITH GFR
ALT: 19 IU/L (ref 0–32)
AST: 16 IU/L (ref 0–40)
Albumin: 4.5 g/dL (ref 3.8–4.9)
Alkaline Phosphatase: 99 IU/L (ref 49–135)
BUN/Creatinine Ratio: 24 — ABNORMAL HIGH (ref 9–23)
BUN: 28 mg/dL — ABNORMAL HIGH (ref 6–24)
Bilirubin Total: 0.3 mg/dL (ref 0.0–1.2)
CO2: 24 mmol/L (ref 20–29)
Calcium: 10 mg/dL (ref 8.7–10.2)
Chloride: 102 mmol/L (ref 96–106)
Creatinine, Ser: 1.16 mg/dL — ABNORMAL HIGH (ref 0.57–1.00)
Globulin, Total: 2.9 g/dL (ref 1.5–4.5)
Glucose: 94 mg/dL (ref 70–99)
Potassium: 4.9 mmol/L (ref 3.5–5.2)
Sodium: 136 mmol/L (ref 134–144)
Total Protein: 7.4 g/dL (ref 6.0–8.5)
eGFR: 54 mL/min/1.73 — ABNORMAL LOW (ref >59)

## 2023-12-19 LAB — CBC WITH DIFFERENTIAL/PLATELET
Basophils Absolute: 0.1 x10E3/uL (ref 0.0–0.2)
Basos: 1 %
EOS (ABSOLUTE): 0.3 x10E3/uL (ref 0.0–0.4)
Eos: 4 %
Hematocrit: 39.9 % (ref 34.0–46.6)
Hemoglobin: 13.1 g/dL (ref 11.1–15.9)
Immature Grans (Abs): 0 x10E3/uL (ref 0.0–0.1)
Immature Granulocytes: 0 %
Lymphocytes Absolute: 2 x10E3/uL (ref 0.7–3.1)
Lymphs: 29 %
MCH: 30.4 pg (ref 26.6–33.0)
MCHC: 32.8 g/dL (ref 31.5–35.7)
MCV: 93 fL (ref 79–97)
Monocytes Absolute: 0.5 x10E3/uL (ref 0.1–0.9)
Monocytes: 8 %
Neutrophils Absolute: 4 x10E3/uL (ref 1.4–7.0)
Neutrophils: 58 %
Platelets: 282 x10E3/uL (ref 150–450)
RBC: 4.31 x10E6/uL (ref 3.77–5.28)
RDW: 12.3 % (ref 11.7–15.4)
WBC: 6.8 x10E3/uL (ref 3.4–10.8)

## 2023-12-19 LAB — LIPID PANEL
Chol/HDL Ratio: 4.5 ratio — ABNORMAL HIGH (ref 0.0–4.4)
Cholesterol, Total: 262 mg/dL — ABNORMAL HIGH (ref 100–199)
HDL: 58 mg/dL (ref >39)
LDL Chol Calc (NIH): 177 mg/dL — ABNORMAL HIGH (ref 0–99)
Triglycerides: 150 mg/dL — ABNORMAL HIGH (ref 0–149)
VLDL Cholesterol Cal: 27 mg/dL (ref 5–40)

## 2023-12-19 LAB — VITAMIN D 25 HYDROXY (VIT D DEFICIENCY, FRACTURES): Vit D, 25-Hydroxy: 17.1 ng/mL — ABNORMAL LOW (ref 30.0–100.0)

## 2023-12-19 LAB — TSH: TSH: 1.6 u[IU]/mL (ref 0.450–4.500)

## 2023-12-20 ENCOUNTER — Ambulatory Visit: Payer: Self-pay | Admitting: Internal Medicine

## 2023-12-20 DIAGNOSIS — B9689 Other specified bacterial agents as the cause of diseases classified elsewhere: Secondary | ICD-10-CM

## 2023-12-21 LAB — CERVICOVAGINAL ANCILLARY ONLY
Bacterial Vaginitis (gardnerella): POSITIVE — AB
Candida Glabrata: NEGATIVE
Candida Vaginitis: NEGATIVE
Chlamydia: NEGATIVE
Comment: NEGATIVE
Comment: NEGATIVE
Comment: NEGATIVE
Comment: NEGATIVE
Comment: NEGATIVE
Comment: NORMAL
Neisseria Gonorrhea: NEGATIVE
Trichomonas: NEGATIVE

## 2023-12-21 MED ORDER — METRONIDAZOLE 500 MG PO TABS: 500.0000 mg | ORAL_TABLET | Freq: Two times a day (BID) | ORAL | 0 refills | Status: AC

## 2023-12-22 LAB — CYTOLOGY - PAP
Adequacy: ABSENT
Comment: NEGATIVE
Diagnosis: NEGATIVE
High risk HPV: NEGATIVE

## 2023-12-23 NOTE — Telephone Encounter (Signed)
 Please review patient response.  JM

## 2024-02-12 NOTE — Telephone Encounter (Signed)
 Please review.  KP

## 2024-06-17 ENCOUNTER — Encounter: Admitting: Student
